# Patient Record
Sex: Male | Born: 1945 | Race: White | Hispanic: No | Marital: Single | Smoking: Never smoker
Health system: Southern US, Community
[De-identification: ages and names within clinical notes are randomized; demographics above are authoritative.]

## PROBLEM LIST (undated history)

## (undated) DIAGNOSIS — E785 Hyperlipidemia, unspecified: Secondary | ICD-10-CM

## (undated) DIAGNOSIS — H269 Unspecified cataract: Secondary | ICD-10-CM

## (undated) DIAGNOSIS — I10 Essential (primary) hypertension: Secondary | ICD-10-CM

## (undated) DIAGNOSIS — G4730 Sleep apnea, unspecified: Secondary | ICD-10-CM

## (undated) DIAGNOSIS — E119 Type 2 diabetes mellitus without complications: Secondary | ICD-10-CM

## (undated) HISTORY — DX: Hyperlipidemia, unspecified: E78.5

## (undated) HISTORY — DX: Sleep apnea, unspecified: G47.30

## (undated) HISTORY — DX: Unspecified cataract: H26.9

## (undated) HISTORY — PX: COLONOSCOPY: SHX174

## (undated) HISTORY — DX: Essential (primary) hypertension: I10

## (undated) HISTORY — DX: Type 2 diabetes mellitus without complications: E11.9

## (undated) HISTORY — PX: CYSTECTOMY: SUR359

---

## 1999-05-14 ENCOUNTER — Encounter (INDEPENDENT_AMBULATORY_CARE_PROVIDER_SITE_OTHER): Payer: Self-pay

## 1999-05-14 ENCOUNTER — Other Ambulatory Visit: Admission: RE | Admit: 1999-05-14 | Discharge: 1999-05-14 | Payer: Self-pay | Admitting: Surgery

## 1999-05-23 ENCOUNTER — Emergency Department (HOSPITAL_COMMUNITY): Admission: EM | Admit: 1999-05-23 | Discharge: 1999-05-23 | Payer: Self-pay | Admitting: *Deleted

## 2000-04-12 ENCOUNTER — Encounter: Payer: Self-pay | Admitting: Internal Medicine

## 2000-04-12 ENCOUNTER — Ambulatory Visit (HOSPITAL_COMMUNITY): Admission: RE | Admit: 2000-04-12 | Discharge: 2000-04-12 | Payer: Self-pay | Admitting: Internal Medicine

## 2000-06-12 ENCOUNTER — Ambulatory Visit (HOSPITAL_COMMUNITY): Admission: RE | Admit: 2000-06-12 | Discharge: 2000-06-12 | Payer: Self-pay | Admitting: Gastroenterology

## 2008-04-06 ENCOUNTER — Encounter: Admission: RE | Admit: 2008-04-06 | Discharge: 2008-04-06 | Payer: Self-pay | Admitting: Family Medicine

## 2010-08-03 NOTE — Procedures (Signed)
[place]  Patient:    Edward Boyd, Edward Boyd                     MRN: [id number] Proc. Date: 06/12/00 Adm. Date:  [id number] Attending:  Charna Elizabeth CC:         Lilly Cove, M.D.                           Procedure Report  DATE OF BIRTH:  [birth year]  PROCEDURE PERFORMED:  Flexible sigmoidoscopy.  ENDOSCOPIST:  Anselmo Rod, M.D.  INSTRUMENT USED:  Olympus video colonoscope.  INDICATIONS FOR PROCEDURE:  Screening flexible sigmoidoscopy being performed in a 65 year old white male rule out colonic polyps, masses, hemorrhoids etc.  PREPROCEDURE PREPARATION:  Informed consent was procured from the patient. The patient was fasted for eight hours prior to the procedure and prepped with a bottle of magnesium citrate and two Fleets enemas on the morning of the procedure.  PREPROCEDURE PHYSICAL:  The patient had stable vital signs.  Neck supple. Chest clear to auscultation.  S1 and S2 regular, abdomen soft with normal abdominal bowel sounds.  DESCRIPTION OF PROCEDURE:  The patient was placed in left lateral decubitus position and sedated with 40 mg of Demerol and 4 mg of Versed intravenously. Once the patient was adequately sedated and maintained on low-flow oxygen  and continuous cardiac monitoring, the Olympus video colonoscope was advanced from the rectum to the cecum without difficulty.  The patient had an excellent prep and therefore the procedure was completed up to the cecum even though this was a flexible sigmoidoscopy.  No masses, polyps, erosions, ulcerations or diverticula were seen.  The appendicular orifice and the ileocecal valve were clearly visualized and photographed.  IMPRESSION:  Normal colonoscopy.  RECOMMENDATIONS:  Repeat colorectal cancer screening is recommended in the next 10 years unless the patient were to develop any abnormal symptoms in the interim. DD:  06/12/00 TD:  06/12/00 Job: [id number]

## 2011-02-23 ENCOUNTER — Ambulatory Visit (INDEPENDENT_AMBULATORY_CARE_PROVIDER_SITE_OTHER): Payer: 59

## 2011-02-23 DIAGNOSIS — J019 Acute sinusitis, unspecified: Secondary | ICD-10-CM

## 2011-03-08 ENCOUNTER — Ambulatory Visit (INDEPENDENT_AMBULATORY_CARE_PROVIDER_SITE_OTHER): Payer: 59

## 2011-03-08 DIAGNOSIS — R071 Chest pain on breathing: Secondary | ICD-10-CM

## 2011-03-08 DIAGNOSIS — J019 Acute sinusitis, unspecified: Secondary | ICD-10-CM

## 2011-03-08 DIAGNOSIS — J209 Acute bronchitis, unspecified: Secondary | ICD-10-CM

## 2011-03-10 ENCOUNTER — Ambulatory Visit (INDEPENDENT_AMBULATORY_CARE_PROVIDER_SITE_OTHER): Payer: 59

## 2011-03-10 DIAGNOSIS — N453 Epididymo-orchitis: Secondary | ICD-10-CM

## 2017-10-31 ENCOUNTER — Ambulatory Visit (INDEPENDENT_AMBULATORY_CARE_PROVIDER_SITE_OTHER): Payer: No Typology Code available for payment source | Admitting: Podiatry

## 2017-10-31 ENCOUNTER — Encounter: Payer: Self-pay | Admitting: Podiatry

## 2017-10-31 VITALS — BP 125/68 | HR 70

## 2017-10-31 DIAGNOSIS — B351 Tinea unguium: Secondary | ICD-10-CM | POA: Diagnosis not present

## 2017-10-31 DIAGNOSIS — E119 Type 2 diabetes mellitus without complications: Secondary | ICD-10-CM | POA: Diagnosis not present

## 2017-10-31 DIAGNOSIS — E1142 Type 2 diabetes mellitus with diabetic polyneuropathy: Secondary | ICD-10-CM | POA: Diagnosis not present

## 2017-10-31 MED ORDER — GABAPENTIN 300 MG PO CAPS: 300.0000 mg | ORAL_CAPSULE | Freq: Four times a day (QID) | ORAL | 0 refills | Status: AC

## 2017-11-26 NOTE — Progress Notes (Signed)
  Subjective:  Patient ID: Edward Boyd, male    DOB: 1946-01-06,  MRN: [id number]  Chief Complaint  Patient presents with  . Diabetes    diabetic foot exam and debride  . Nail Problem    possible ingrown 2nd left toenail  . Peripheral Neuropathy    numbness and pain increased, bilateral feet x 4 months    72 y.o. male presents  for diabetic foot care. Last AMBS was unknown.  Reports numbness and tingling in their feet. Denies cramping in legs and thighs.  Review of Systems: Negative except as noted in the HPI. Denies N/V/F/Ch.  History reviewed. No pertinent past medical history.  Current Outpatient Medications:  .  aspirin EC 81 MG tablet, Take 81 mg by mouth daily., Disp: , Rfl:  .  cholecalciferol (VITAMIN D) 400 units TABS tablet, Take 400 Units by mouth., Disp: , Rfl:  .  gabapentin (NEURONTIN) 300 MG capsule, Take 1 capsule (300 mg total) by mouth 4 (four) times daily., Disp: 120 capsule, Rfl: 0 .  hydrochlorothiazide (HYDRODIURIL) 25 MG tablet, Take 25 mg by mouth daily., Disp: , Rfl:  .  insulin regular (NOVOLIN R,HUMULIN R) 100 units/mL injection, Inject into the skin 3 (three) times daily before meals., Disp: , Rfl:  .  lisinopril (PRINIVIL,ZESTRIL) 40 MG tablet, Take 40 mg by mouth daily., Disp: , Rfl:  .  magnesium gluconate (MAGONATE) 500 MG tablet, Take 500 mg by mouth 2 (two) times daily., Disp: , Rfl:  .  metFORMIN (GLUCOPHAGE) 1000 MG tablet, Take 1,000 mg by mouth 2 (two) times daily with a meal., Disp: , Rfl:  .  sildenafil (REVATIO) 20 MG tablet, Take 20 mg by mouth 3 (three) times daily., Disp: , Rfl:  .  simvastatin (ZOCOR) 80 MG tablet, Take 80 mg by mouth daily., Disp: , Rfl:  .  vitamin B-12 (CYANOCOBALAMIN) 1000 MCG tablet, Take 1,000 mcg by mouth daily., Disp: , Rfl:   Social History   Tobacco Use  Smoking Status Not on file    Allergies  Allergen Reactions  . Penicillins    Objective:   Vitals:   10/31/17 1450  BP: 125/68  Pulse: 70    There is no height or weight on file to calculate BMI. Constitutional Well developed. Well nourished.  Vascular Dorsalis pedis pulses present 1+ bilaterally  Posterior tibial pulses present 1+ bilaterally  Pedal hair growth diminished. Capillary refill normal to all digits.  No cyanosis or clubbing noted.  Neurologic Normal speech. Oriented to person, place, and time. Epicritic sensation to light touch grossly present bilaterally. Protective sensation with 5.07 monofilament  absent  bilaterally.  Dermatologic Nails elongated, thickened, dystrophic. No open wounds. No skin lesions.  Orthopedic: Normal joint ROM without pain or crepitus bilaterally. No visible deformities. No bony tenderness.   Assessment:   1. DM type 2 with diabetic peripheral neuropathy ([place])   2. Onychomycosis   3. Encounter for diabetic foot exam [facility])    Plan:  Patient was evaluated and treated and all questions answered.  Diabetes with DPN, Onychomycosis -Educated on diabetic footcare. Diabetic risk level 1 -Nails x10 debrided sharply and manually with large nail nipper and rotary burr.   Procedure: Nail Debridement Rationale: Patient meets criteria for routine foot care due to DPN Type of Debridement: manual, sharp debridement. Instrumentation: Nail nipper, rotary burr. Number of Nails: 10  Return in about 3 months (around 01/31/2018) for Diabetic Foot Care.

## 2018-05-21 ENCOUNTER — Encounter: Payer: Self-pay | Admitting: Internal Medicine

## 2018-06-04 ENCOUNTER — Telehealth: Payer: Self-pay | Admitting: *Deleted

## 2018-06-04 NOTE — Telephone Encounter (Signed)
Covid-19 travel screening questions  Have you traveled in the last 14 days? No If yes where?   Do you now or have you had a fever in the last 14 days? No  Do you have any respiratory symptoms of shortness of breath or cough now or in the last 14 days? No  Do you have a medical history of Congestive Heart Failure? No  Do you have a medical history of lung disease? No  Do you have any family members or close contacts with diagnosed or suspected Covid-19? No        

## 2018-06-18 ENCOUNTER — Encounter: Payer: Self-pay | Admitting: Internal Medicine

## 2018-07-10 ENCOUNTER — Encounter: Payer: Self-pay | Admitting: Internal Medicine

## 2018-07-30 ENCOUNTER — Encounter: Payer: Self-pay | Admitting: Internal Medicine

## 2018-08-03 ENCOUNTER — Other Ambulatory Visit: Payer: Self-pay

## 2018-08-03 ENCOUNTER — Ambulatory Visit: Payer: Non-veteran care | Admitting: *Deleted

## 2018-08-03 VITALS — Ht 70.0 in | Wt 250.0 lb

## 2018-08-03 DIAGNOSIS — Z1211 Encounter for screening for malignant neoplasm of colon: Secondary | ICD-10-CM

## 2018-08-03 MED ORDER — NA SULFATE-K SULFATE-MG SULF 17.5-3.13-1.6 GM/177ML PO SOLN: 1.0000 | Freq: Once | ORAL | 0 refills | Status: AC

## 2018-08-03 NOTE — Progress Notes (Signed)
No egg or soy allergy known to patient  No issues with past sedation with any surgeries  or procedures, no intubation problems  No diet pills per patient No home 02 use per patient  No blood thinners per patient  Pt denies issues with constipation  No A fib or A flutter  EMMI video declined    Pt mailed instruction packet to included paper to complete and mail back to [facility] with addressed and stamped envelope, Emmi video, copy of consent form to read and not return, and instructions. PV completed over the phone. Pt encouraged to call with questions or issues - pt will be given a Suprep sample- pt will pick up prep and packet 3rd floor this week    Suprep Lot- [id number] exp 04/2020- pt to pick up and wear a mask

## 2018-09-07 ENCOUNTER — Encounter: Payer: Self-pay | Admitting: Internal Medicine

## 2018-09-16 ENCOUNTER — Encounter: Payer: Self-pay | Admitting: Internal Medicine

## 2018-10-27 ENCOUNTER — Other Ambulatory Visit: Payer: Self-pay

## 2018-10-27 ENCOUNTER — Ambulatory Visit (AMBULATORY_SURGERY_CENTER): Payer: Self-pay | Admitting: *Deleted

## 2018-10-27 VITALS — Ht 70.0 in | Wt 225.0 lb

## 2018-10-27 DIAGNOSIS — Z1211 Encounter for screening for malignant neoplasm of colon: Secondary | ICD-10-CM

## 2018-10-27 NOTE — Progress Notes (Signed)
Pt's previsit is done over the phone and all paperwork (prep instructions, blank consent form to just read over, pre-procedure acknowledgement form and stamped envelope) sent to patient  Pt is aware that care partner will wait in the car during procedure; if they feel like they will be too hot to wait in the car; they may wait in the lobby.  We want them to wear a mask (we do not have any that we can provide them), practice social distancing, and we will check their temperatures when they get here.  I did remind patient that their care partner needs to stay in the parking lot the entire time. Pt will wear mask into building.   Pt has Suprep at home  No egg or soy allergy  No home oxygen use or problems with anesthesia per pt  No medications for weight loss taken  emmi information given

## 2018-11-09 ENCOUNTER — Telehealth: Payer: Self-pay

## 2018-11-09 ENCOUNTER — Telehealth: Payer: Self-pay | Admitting: Internal Medicine

## 2018-11-09 NOTE — Telephone Encounter (Signed)
Pt and I discussed how to mix the Suprep tonight at 6 pm - add 1 bottle Suprep  to the plastic container and then add water to the top 16 oz line- mix and drink over 30 minutes followed by 2 - 16 oz containers of water  And then he will repeat the same thing tomorrow at 830 am and stop all liquids by 1030 am - pt verbalized understanding of instructions given today

## 2018-11-09 NOTE — Telephone Encounter (Signed)
Patient had questions when he was doing his covid screening call. He was unsure if he had to take his metformin tomorrow. I advised him not to taker his metformin the day of procedure. He understood and and we went through the remainder of his medications and now feels confident about it. He will be here tomorrow for his procedure and had no further questions.

## 2018-11-09 NOTE — Telephone Encounter (Signed)
Pt has a question about his prep.  His procedure is 11/10/18

## 2018-11-09 NOTE — Telephone Encounter (Signed)
Covid-19 screening questions   Do you now or have you had a fever in the last 14 days?  Do you have any respiratory symptoms of shortness of breath or cough now or in the last 14 days?  Do you have any family members or close contacts with diagnosed or suspected Covid-19 in the past 14 days?  Have you been tested for Covid-19 and found to be positive?      L/m to c/b.

## 2018-11-09 NOTE — Telephone Encounter (Signed)
Patient called back and answered no to all screening questions. Is requesting to speak to nurse because he is confused with his medications and he is diabetic.

## 2018-11-10 ENCOUNTER — Encounter: Payer: Self-pay | Admitting: Internal Medicine

## 2018-11-10 ENCOUNTER — Ambulatory Visit (AMBULATORY_SURGERY_CENTER): Payer: No Typology Code available for payment source | Admitting: Internal Medicine

## 2018-11-10 ENCOUNTER — Other Ambulatory Visit: Payer: Self-pay

## 2018-11-10 VITALS — BP 113/63 | HR 75 | Temp 98.8°F | Resp 13 | Ht 70.0 in | Wt 225.0 lb

## 2018-11-10 DIAGNOSIS — D124 Benign neoplasm of descending colon: Secondary | ICD-10-CM | POA: Diagnosis not present

## 2018-11-10 DIAGNOSIS — D122 Benign neoplasm of ascending colon: Secondary | ICD-10-CM

## 2018-11-10 DIAGNOSIS — D123 Benign neoplasm of transverse colon: Secondary | ICD-10-CM

## 2018-11-10 DIAGNOSIS — Z1211 Encounter for screening for malignant neoplasm of colon: Secondary | ICD-10-CM

## 2018-11-10 MED ORDER — SODIUM CHLORIDE 0.9 % IV SOLN: 500.0000 mL | Freq: Once | INTRAVENOUS | Status: DC

## 2018-11-10 NOTE — Progress Notes (Signed)
Pt's states no medical or surgical changes since previsit or office visit.  MB temp CW vitals.

## 2018-11-10 NOTE — Progress Notes (Signed)
Called to room to assist during endoscopic procedure.  Patient ID and intended procedure confirmed with present staff. Received instructions for my participation in the procedure from the performing physician.  

## 2018-11-10 NOTE — Op Note (Signed)
[place] Patient Name: Edward Boyd Procedure Date: 11/10/2018 1:31 PM MRN: [id number] Endoscopist: Docia Chuck. Henrene Pastor , MD Age: 73 Referring MD:  Date of Birth: 1945/09/11 Gender: Male Account #: [id number] Procedure:                Colonoscopy with cold snare polypectomy x 1 Indications:              Screening for colorectal malignant neoplasm.                            Negative index exam elsewhere 2002 Medicines:                Monitored Anesthesia Care Procedure:                Pre-Anesthesia Assessment:                           - Prior to the procedure, a History and Physical                            was performed, and patient medications and                            allergies were reviewed. The patient's tolerance of                            previous anesthesia was also reviewed. The risks                            and benefits of the procedure and the sedation                            options and risks were discussed with the patient.                            All questions were answered, and informed consent                            was obtained. Prior Anticoagulants: The patient has                            taken no previous anticoagulant or antiplatelet                            agents. ASA Grade Assessment: II - A patient with                            mild systemic disease. After reviewing the risks                            and benefits, the patient was deemed in                            satisfactory condition to undergo the procedure.  After obtaining informed consent, the colonoscope                            was passed under direct vision. Throughout the                            procedure, the patient's blood pressure, pulse, and                            oxygen saturations were monitored continuously. The                            Colonoscope was introduced through the anus and   advanced to the the cecum, identified by                            appendiceal orifice and ileocecal valve. The                            ileocecal valve, appendiceal orifice, and rectum                            were photographed. The quality of the bowel                            preparation was excellent. The colonoscopy was                            performed without difficulty. The patient tolerated                            the procedure well. The bowel preparation used was                            SUPREP via split dose instruction. Scope In: 1:35:51 PM Scope Out: 1:45:59 PM Scope Withdrawal Time: 0 hours 7 minutes 0 seconds  Total Procedure Duration: 0 hours 10 minutes 8 seconds  Findings:                 Two polyps were found in the transverse colon and                            ascending colon. The polyps were 1 to 3 mm in size.                            These polyps were removed with a cold snare.                            Resection and retrieval were complete.                           The exam was otherwise without abnormality on  direct and retroflexion views. Complications:            No immediate complications. Estimated blood loss:                            None. Estimated Blood Loss:     Estimated blood loss: none. Impression:               - Two 1 to 3 mm polyps in the transverse colon and                            in the ascending colon, removed with a cold snare.                            Resected and retrieved.                           - The examination was otherwise normal on direct                            and retroflexion views. Recommendation:           - Repeat colonoscopy is not recommended for                            surveillance.                           - Patient has a contact number available for                            emergencies. The signs and symptoms of potential                            delayed  complications were discussed with the                            patient. Return to normal activities tomorrow.                            Written discharge instructions were provided to the                            patient.                           - Resume previous diet.                           - Continue present medications.                           - Await pathology results. Docia Chuck. Henrene Pastor, MD 11/10/2018 1:50:53 PM This report has been signed electronically.

## 2018-11-10 NOTE — Progress Notes (Signed)
PT taken to PACU. Monitors in place. VSS. Report given to RN. 

## 2018-11-10 NOTE — Patient Instructions (Signed)
YOU HAD AN ENDOSCOPIC PROCEDURE TODAY AT THE [facility]:   Refer to the procedure report that was given to you for any specific questions about what was found during the examination.  If the procedure report does not answer your questions, please call your gastroenterologist to clarify.  If you requested that your care partner not be given the details of your procedure findings, then the procedure report has been included in a sealed envelope for you to review at your convenience later.  YOU SHOULD EXPECT: Some feelings of bloating in the abdomen. Passage of more gas than usual.  Walking can help get rid of the air that was put into your GI tract during the procedure and reduce the bloating. If you had a lower endoscopy (such as a colonoscopy or flexible sigmoidoscopy) you may notice spotting of blood in your stool or on the toilet paper. If you underwent a bowel prep for your procedure, you may not have a normal bowel movement for a few days.  Please Note:  You might notice some irritation and congestion in your nose or some drainage.  This is from the oxygen used during your procedure.  There is no need for concern and it should clear up in a day or so.  SYMPTOMS TO REPORT IMMEDIATELY:   Following lower endoscopy (colonoscopy or flexible sigmoidoscopy):  Excessive amounts of blood in the stool  Significant tenderness or worsening of abdominal pains  Swelling of the abdomen that is new, acute  Fever of 100F or higher  For urgent or emergent issues, a gastroenterologist can be reached at any hour by calling [phone].   DIET:  We do recommend a small meal at first, but then you may proceed to your regular diet.  Drink plenty of fluids but you should avoid alcoholic beverages for 24 hours.  ACTIVITY:  You should plan to take it easy for the rest of today and you should NOT DRIVE or use heavy machinery until tomorrow (because of the sedation medicines used during the test).     FOLLOW UP: Our staff will call the number listed on your records 48-72 hours following your procedure to check on you and address any questions or concerns that you may have regarding the information given to you following your procedure. If we do not reach you, we will leave a message.  We will attempt to reach you two times.  During this call, we will ask if you have developed any symptoms of COVID 19. If you develop any symptoms (ie: fever, flu-like symptoms, shortness of breath, cough etc.) before then, please call [phone].  If you test positive for Covid 19 in the 2 weeks post procedure, please call and report this information to us.    If any biopsies were taken you will be contacted by phone or by letter within the next 1-3 weeks.  Please call us at [phone] if you have not heard about the biopsies in 3 weeks.    SIGNATURES/CONFIDENTIALITY: You and/or your care partner have signed paperwork which will be entered into your electronic medical record.  These signatures attest to the fact that that the information above on your After Visit Summary has been reviewed and is understood.  Full responsibility of the confidentiality of this discharge information lies with you and/or your care-partner. 

## 2018-11-12 ENCOUNTER — Telehealth: Payer: Self-pay | Admitting: *Deleted

## 2018-11-12 NOTE — Telephone Encounter (Signed)
1. Have you developed a fever since your procedure? no  2.   Have you had an respiratory symptoms (SOB or cough) since your procedure? no  3.   Have you tested positive for COVID 19 since your procedure no  4.   Have you had any family members/close contacts diagnosed with the COVID 19 since your procedure?  no   If yes to any of these questions please route to Joylene John, RN and Alphonsa Gin, Therapist, sports.  Follow up Call-  Call back number 11/10/2018  Post procedure Call Back phone  # [phone]  Permission to leave phone message Yes  Some recent data might be hidden     Patient questions:  Do you have a fever, pain , or abdominal swelling? No. Pain Score  0 *  Have you tolerated food without any problems? Yes.    Have you been able to return to your normal activities? Yes.    Do you have any questions about your discharge instructions: Diet   No. Medications  No. Follow up visit  No.  Do you have questions or concerns about your Care? No.  Actions: * If pain score is 4 or above: No action needed, pain <4.

## 2018-11-14 ENCOUNTER — Encounter: Payer: Self-pay | Admitting: Internal Medicine

## 2019-03-25 ENCOUNTER — Encounter (HOSPITAL_COMMUNITY): Payer: Self-pay

## 2019-03-25 ENCOUNTER — Other Ambulatory Visit: Payer: Self-pay

## 2019-03-25 ENCOUNTER — Inpatient Hospital Stay (HOSPITAL_COMMUNITY)
Admission: EM | Admit: 2019-03-25 | Discharge: 2019-03-27 | DRG: 684 | Disposition: A | Discharge status: Home / Self Care | Payer: No Typology Code available for payment source | Attending: Family Medicine | Admitting: Family Medicine

## 2019-03-25 DIAGNOSIS — N19 Unspecified kidney failure: Secondary | ICD-10-CM

## 2019-03-25 DIAGNOSIS — I10 Essential (primary) hypertension: Secondary | ICD-10-CM | POA: Diagnosis present

## 2019-03-25 DIAGNOSIS — Z794 Long term (current) use of insulin: Secondary | ICD-10-CM

## 2019-03-25 DIAGNOSIS — E669 Obesity, unspecified: Secondary | ICD-10-CM | POA: Diagnosis present

## 2019-03-25 DIAGNOSIS — N170 Acute kidney failure with tubular necrosis: Principal | ICD-10-CM | POA: Diagnosis present

## 2019-03-25 DIAGNOSIS — Z6835 Body mass index (BMI) 35.0-35.9, adult: Secondary | ICD-10-CM

## 2019-03-25 DIAGNOSIS — Z7982 Long term (current) use of aspirin: Secondary | ICD-10-CM

## 2019-03-25 DIAGNOSIS — Z79899 Other long term (current) drug therapy: Secondary | ICD-10-CM

## 2019-03-25 DIAGNOSIS — E119 Type 2 diabetes mellitus without complications: Secondary | ICD-10-CM | POA: Diagnosis present

## 2019-03-25 DIAGNOSIS — Z20822 Contact with and (suspected) exposure to covid-19: Secondary | ICD-10-CM | POA: Diagnosis present

## 2019-03-25 DIAGNOSIS — R55 Syncope and collapse: Secondary | ICD-10-CM | POA: Diagnosis present

## 2019-03-25 DIAGNOSIS — E785 Hyperlipidemia, unspecified: Secondary | ICD-10-CM | POA: Diagnosis present

## 2019-03-25 DIAGNOSIS — Z880 Allergy status to penicillin: Secondary | ICD-10-CM

## 2019-03-25 DIAGNOSIS — G4733 Obstructive sleep apnea (adult) (pediatric): Secondary | ICD-10-CM | POA: Diagnosis present

## 2019-03-25 DIAGNOSIS — N179 Acute kidney failure, unspecified: Secondary | ICD-10-CM | POA: Diagnosis present

## 2019-03-25 NOTE — ED Triage Notes (Signed)
Pt fell at home, has been dizzy.  Per ems, pt's blood pressure dropped to 99991111 systolic

## 2019-03-26 ENCOUNTER — Emergency Department (HOSPITAL_COMMUNITY): Payer: No Typology Code available for payment source

## 2019-03-26 ENCOUNTER — Inpatient Hospital Stay (HOSPITAL_COMMUNITY): Payer: No Typology Code available for payment source

## 2019-03-26 DIAGNOSIS — Z79899 Other long term (current) drug therapy: Secondary | ICD-10-CM | POA: Diagnosis not present

## 2019-03-26 DIAGNOSIS — N170 Acute kidney failure with tubular necrosis: Secondary | ICD-10-CM | POA: Diagnosis present

## 2019-03-26 DIAGNOSIS — Z880 Allergy status to penicillin: Secondary | ICD-10-CM | POA: Diagnosis not present

## 2019-03-26 DIAGNOSIS — Z794 Long term (current) use of insulin: Secondary | ICD-10-CM | POA: Diagnosis not present

## 2019-03-26 DIAGNOSIS — E119 Type 2 diabetes mellitus without complications: Secondary | ICD-10-CM | POA: Diagnosis present

## 2019-03-26 DIAGNOSIS — E785 Hyperlipidemia, unspecified: Secondary | ICD-10-CM | POA: Diagnosis present

## 2019-03-26 DIAGNOSIS — Z20822 Contact with and (suspected) exposure to covid-19: Secondary | ICD-10-CM | POA: Diagnosis present

## 2019-03-26 DIAGNOSIS — N19 Unspecified kidney failure: Secondary | ICD-10-CM | POA: Diagnosis not present

## 2019-03-26 DIAGNOSIS — E669 Obesity, unspecified: Secondary | ICD-10-CM | POA: Diagnosis present

## 2019-03-26 DIAGNOSIS — G4733 Obstructive sleep apnea (adult) (pediatric): Secondary | ICD-10-CM | POA: Diagnosis present

## 2019-03-26 DIAGNOSIS — R55 Syncope and collapse: Secondary | ICD-10-CM

## 2019-03-26 DIAGNOSIS — I10 Essential (primary) hypertension: Secondary | ICD-10-CM | POA: Diagnosis present

## 2019-03-26 DIAGNOSIS — Z6835 Body mass index (BMI) 35.0-35.9, adult: Secondary | ICD-10-CM | POA: Diagnosis not present

## 2019-03-26 DIAGNOSIS — Z7982 Long term (current) use of aspirin: Secondary | ICD-10-CM | POA: Diagnosis not present

## 2019-03-26 DIAGNOSIS — N179 Acute kidney failure, unspecified: Secondary | ICD-10-CM

## 2019-03-26 LAB — CBC WITH DIFFERENTIAL/PLATELET
Abs Immature Granulocytes: 0.05 10*3/uL (ref 0.00–0.07)
Basophils Absolute: 0 10*3/uL (ref 0.0–0.1)
Basophils Relative: 0 %
Eosinophils Absolute: 0.1 10*3/uL (ref 0.0–0.5)
Eosinophils Relative: 1 %
HCT: 43.5 % (ref 39.0–52.0)
Hemoglobin: 13.9 g/dL (ref 13.0–17.0)
Immature Granulocytes: 0 %
Lymphocytes Relative: 14 %
Lymphs Abs: 1.8 10*3/uL (ref 0.7–4.0)
MCH: 29.4 pg (ref 26.0–34.0)
MCHC: 32 g/dL (ref 30.0–36.0)
MCV: 92 fL (ref 80.0–100.0)
Monocytes Absolute: 1.4 10*3/uL — ABNORMAL HIGH (ref 0.1–1.0)
Monocytes Relative: 10 %
Neutro Abs: 10.2 10*3/uL — ABNORMAL HIGH (ref 1.7–7.7)
Neutrophils Relative %: 75 %
Platelets: 236 10*3/uL (ref 150–400)
RBC: 4.73 MIL/uL (ref 4.22–5.81)
RDW: 13.6 % (ref 11.5–15.5)
WBC: 13.6 10*3/uL — ABNORMAL HIGH (ref 4.0–10.5)
nRBC: 0 % (ref 0.0–0.2)

## 2019-03-26 LAB — URINALYSIS, ROUTINE W REFLEX MICROSCOPIC
Bacteria, UA: NONE SEEN
Bilirubin Urine: NEGATIVE
Glucose, UA: NEGATIVE mg/dL
Ketones, ur: NEGATIVE mg/dL
Nitrite: NEGATIVE
Protein, ur: 30 mg/dL — AB
Specific Gravity, Urine: 1.014 (ref 1.005–1.030)
pH: 5 (ref 5.0–8.0)

## 2019-03-26 LAB — COMPREHENSIVE METABOLIC PANEL
ALT: 19 U/L (ref 0–44)
ALT: 16 U/L (ref 0–44)
AST: 25 U/L (ref 15–41)
AST: 22 U/L (ref 15–41)
Albumin: 4.2 g/dL (ref 3.5–5.0)
Albumin: 3.7 g/dL (ref 3.5–5.0)
Alkaline Phosphatase: 56 U/L (ref 38–126)
Alkaline Phosphatase: 50 U/L (ref 38–126)
Anion gap: 16 — ABNORMAL HIGH (ref 5–15)
Anion gap: 12 (ref 5–15)
BUN: 56 mg/dL — ABNORMAL HIGH (ref 8–23)
BUN: 55 mg/dL — ABNORMAL HIGH (ref 8–23)
CO2: 20 mmol/L — ABNORMAL LOW (ref 22–32)
CO2: 23 mmol/L (ref 22–32)
Calcium: 9.6 mg/dL (ref 8.9–10.3)
Calcium: 9.1 mg/dL (ref 8.9–10.3)
Chloride: 98 mmol/L (ref 98–111)
Chloride: 99 mmol/L (ref 98–111)
Creatinine, Ser: 6.09 mg/dL — ABNORMAL HIGH (ref 0.61–1.24)
Creatinine, Ser: 5.79 mg/dL — ABNORMAL HIGH (ref 0.61–1.24)
GFR calc Af Amer: 10 mL/min — ABNORMAL LOW (ref >60)
GFR calc Af Amer: 10 mL/min — ABNORMAL LOW (ref >60)
GFR calc non Af Amer: 8 mL/min — ABNORMAL LOW (ref >60)
GFR calc non Af Amer: 9 mL/min — ABNORMAL LOW (ref >60)
Glucose, Bld: 196 mg/dL — ABNORMAL HIGH (ref 70–99)
Glucose, Bld: 170 mg/dL — ABNORMAL HIGH (ref 70–99)
Potassium: 4.1 mmol/L (ref 3.5–5.1)
Potassium: 3.9 mmol/L (ref 3.5–5.1)
Sodium: 134 mmol/L — ABNORMAL LOW (ref 135–145)
Sodium: 134 mmol/L — ABNORMAL LOW (ref 135–145)
Total Bilirubin: 0.6 mg/dL (ref 0.3–1.2)
Total Bilirubin: 0.6 mg/dL (ref 0.3–1.2)
Total Protein: 7.4 g/dL (ref 6.5–8.1)
Total Protein: 6.7 g/dL (ref 6.5–8.1)

## 2019-03-26 LAB — PROTEIN / CREATININE RATIO, URINE
Creatinine, Urine: 249.31 mg/dL
Protein Creatinine Ratio: 0.28 mg/mg{Cre} — ABNORMAL HIGH (ref 0.00–0.15)
Total Protein, Urine: 69 mg/dL

## 2019-03-26 LAB — SODIUM, URINE, RANDOM: Sodium, Ur: 49 mmol/L

## 2019-03-26 LAB — CBG MONITORING, ED
Glucose-Capillary: 150 mg/dL — ABNORMAL HIGH (ref 70–99)
Glucose-Capillary: 211 mg/dL — ABNORMAL HIGH (ref 70–99)
Glucose-Capillary: 211 mg/dL — ABNORMAL HIGH (ref 70–99)
Glucose-Capillary: 257 mg/dL — ABNORMAL HIGH (ref 70–99)

## 2019-03-26 LAB — CBC
HCT: 40.3 % (ref 39.0–52.0)
Hemoglobin: 12.9 g/dL — ABNORMAL LOW (ref 13.0–17.0)
MCH: 29.5 pg (ref 26.0–34.0)
MCHC: 32 g/dL (ref 30.0–36.0)
MCV: 92.2 fL (ref 80.0–100.0)
Platelets: 214 10*3/uL (ref 150–400)
RBC: 4.37 MIL/uL (ref 4.22–5.81)
RDW: 13.7 % (ref 11.5–15.5)
WBC: 9.3 10*3/uL (ref 4.0–10.5)
nRBC: 0 % (ref 0.0–0.2)

## 2019-03-26 LAB — TROPONIN I (HIGH SENSITIVITY)
Troponin I (High Sensitivity): 7 ng/L (ref <18)
Troponin I (High Sensitivity): 5 ng/L (ref <18)

## 2019-03-26 LAB — HEMOGLOBIN A1C
Hgb A1c MFr Bld: 8.5 % — ABNORMAL HIGH (ref 4.8–5.6)
Mean Plasma Glucose: 197.25 mg/dL

## 2019-03-26 LAB — BRAIN NATRIURETIC PEPTIDE: B Natriuretic Peptide: 45 pg/mL (ref 0.0–100.0)

## 2019-03-26 LAB — MAGNESIUM: Magnesium: 1.8 mg/dL (ref 1.7–2.4)

## 2019-03-26 LAB — SARS CORONAVIRUS 2 (TAT 6-24 HRS): SARS Coronavirus 2: NEGATIVE

## 2019-03-26 LAB — CREATININE, URINE, RANDOM: Creatinine, Urine: 246.67 mg/dL

## 2019-03-26 LAB — CK: Total CK: 391 U/L (ref 49–397)

## 2019-03-26 LAB — I-STAT CREATININE, ED: Creatinine, Ser: 5.5 mg/dL — ABNORMAL HIGH (ref 0.61–1.24)

## 2019-03-26 MED ORDER — ONDANSETRON HCL 4 MG PO TABS: 4.0000 mg | ORAL_TABLET | Freq: Four times a day (QID) | ORAL | Status: DC | PRN

## 2019-03-26 MED ORDER — SODIUM CHLORIDE 0.9 % IV SOLN: INTRAVENOUS | Status: DC

## 2019-03-26 MED ORDER — ATORVASTATIN CALCIUM 40 MG PO TABS
40.0000 mg | ORAL_TABLET | Freq: Every day | ORAL | Status: DC
  Administered 2019-03-26: 40 mg via ORAL
  Filled 2019-03-26: qty 1

## 2019-03-26 MED ORDER — LACTATED RINGERS IV BOLUS
1000.0000 mL | Freq: Once | INTRAVENOUS | Status: AC
  Administered 2019-03-26: 1000 mL via INTRAVENOUS

## 2019-03-26 MED ORDER — INSULIN ASPART 100 UNIT/ML ~~LOC~~ SOLN
0.0000 [IU] | Freq: Three times a day (TID) | SUBCUTANEOUS | Status: DC
  Administered 2019-03-26: 5 [IU] via SUBCUTANEOUS
  Administered 2019-03-26: 3 [IU] via SUBCUTANEOUS
  Administered 2019-03-26: 1 [IU] via SUBCUTANEOUS
  Administered 2019-03-27: 3 [IU] via SUBCUTANEOUS
  Filled 2019-03-26 (×4): qty 1

## 2019-03-26 MED ORDER — HEPARIN SODIUM (PORCINE) 5000 UNIT/ML IJ SOLN
5000.0000 [IU] | Freq: Three times a day (TID) | INTRAMUSCULAR | Status: DC
  Administered 2019-03-26 – 2019-03-27 (×4): 5000 [IU] via SUBCUTANEOUS
  Filled 2019-03-26 (×4): qty 1

## 2019-03-26 MED ORDER — INFLUENZA VAC A&B SA ADJ QUAD 0.5 ML IM PRSY: 0.5000 mL | PREFILLED_SYRINGE | INTRAMUSCULAR | Status: DC

## 2019-03-26 MED ORDER — ONDANSETRON HCL 4 MG/2ML IJ SOLN: 4.0000 mg | Freq: Four times a day (QID) | INTRAMUSCULAR | Status: DC | PRN

## 2019-03-26 MED ORDER — INSULIN ASPART PROT & ASPART (70-30 MIX) 100 UNIT/ML ~~LOC~~ SUSP
60.0000 [IU] | Freq: Two times a day (BID) | SUBCUTANEOUS | Status: DC
  Administered 2019-03-27: 60 [IU] via SUBCUTANEOUS
  Filled 2019-03-26: qty 10

## 2019-03-26 MED ORDER — ACETAMINOPHEN 650 MG RE SUPP: 650.0000 mg | Freq: Four times a day (QID) | RECTAL | Status: DC | PRN

## 2019-03-26 MED ORDER — ACETAMINOPHEN 325 MG PO TABS
650.0000 mg | ORAL_TABLET | Freq: Four times a day (QID) | ORAL | Status: DC | PRN
  Administered 2019-03-27: 650 mg via ORAL
  Filled 2019-03-26: qty 2

## 2019-03-26 MED ORDER — ASPIRIN EC 81 MG PO TBEC
81.0000 mg | DELAYED_RELEASE_TABLET | Freq: Every day | ORAL | Status: DC
  Administered 2019-03-26: 81 mg via ORAL
  Filled 2019-03-26 (×2): qty 1

## 2019-03-26 NOTE — ED Notes (Signed)
No more than 60mL found with bladder scanner

## 2019-03-26 NOTE — Progress Notes (Signed)
Brief update note:  Chart reviewed. Patient seen and updated on plan of care. Nephrology consulted for AKI vs CKD. Appreciate input. Attempted calling patient's PCP office with [facility] ([facility]) at [phone] to get records (baseline renal function/ h/o kidney disease) but offices are closed today. I was transferred to [facility] ED where I could get some info on the patient, but no response.  Needs inpatient stay for management of elevated creatinine.   Budd Palmer, MD Internal Medicine  Hospitalist

## 2019-03-26 NOTE — ED Notes (Signed)
Hospitalist at bedside 

## 2019-03-26 NOTE — ED Notes (Signed)
Pt is a [place] vet followed at the [employer]

## 2019-03-26 NOTE — H&P (Signed)
TRH H&P    Patient Demographics:    Edward Boyd, is a 74 y.o. male  MRN: [id number]  DOB - 11/08/1945  Admit Date - 03/25/2019  Referring MD/NP/PA: Dorise Bullion  Outpatient Primary MD for the patient is Patient, No Pcp Per  Patient coming from: Home  Chief complaint-dizziness   HPI:    Edward Boyd  is a 74 y.o. male, with history of diabetes mellitus type 2, hypertension, sleep apnea, hyperlipidemia came to ED with complaints of dizziness, near syncope.  Patient says that he started feeling dizzy yesterday morning and felt very weak and fell to ground.  Patient said that he has been eating and drinking normally.  Yesterday he did not take his blood pressure medications as blood pressure was low. In the ED patient was found to be in acute kidney injury with creatinine 6.09, repeat labs showed creatinine 5.50.  Patient takes HCTZ, lisinopril, Metformin at home. He denies nausea vomiting or diarrhea. Denies chest pain or shortness of breath. Denies dysuria. Denies prostate problems. Denies history of CAD or seizures. Denies abdominal pain      Review of systems:    In addition to the HPI above,    All other systems reviewed and are negative.    Past History of the following :    Past Medical History:  Diagnosis Date  . Cataract    righ teye removed with lens implant, left eye cataract forming   . Diabetes mellitus without complication ([place])   . Hyperlipidemia   . Hypertension   . Sleep apnea    wears cpap       Past Surgical History:  Procedure Laterality Date  . COLONOSCOPY    . CYSTECTOMY     back       Social History:      Social History   Tobacco Use  . Smoking status: Never Smoker  . Smokeless tobacco: Never Used  Substance Use Topics  . Alcohol use: Yes    Comment: very rare        Family History :     Family History  Problem Relation Age of Onset  .  Colon cancer Neg Hx   . Colon polyps Neg Hx   . Esophageal cancer Neg Hx   . Rectal cancer Neg Hx   . Stomach cancer Neg Hx       Home Medications:   Prior to Admission medications   Medication Sig Start Date End Date Taking? Authorizing Provider  aspirin EC 81 MG tablet Take 81 mg by mouth daily.    [provider]  cholecalciferol (VITAMIN D) 400 units TABS tablet Take 400 Units by mouth.    [provider]  gabapentin (NEURONTIN) 300 MG capsule Take 1 capsule (300 mg total) by mouth 4 (four) times daily. 10/31/17   Evelina Bucy, DPM  hydrochlorothiazide (HYDRODIURIL) 25 MG tablet Take 25 mg by mouth daily.    [provider]  insulin NPH-regular Human (70-30) 100 UNIT/ML injection Inject into the skin. Takes 60 units twice  a day- before meals    [provider]  insulin regular (NOVOLIN R,HUMULIN R) 100 units/mL injection Inject into the skin 3 (three) times daily before meals.    [provider]  lisinopril (PRINIVIL,ZESTRIL) 40 MG tablet Take 40 mg by mouth daily.    [provider]  magnesium gluconate (MAGONATE) 500 MG tablet Take 500 mg by mouth 2 (two) times daily.    [provider]  metFORMIN (GLUCOPHAGE) 1000 MG tablet Take 1,000 mg by mouth 2 (two) times daily with a meal.    [provider]  sildenafil (REVATIO) 20 MG tablet Take 20 mg by mouth 3 (three) times daily.    [provider]  simvastatin (ZOCOR) 80 MG tablet Take 80 mg by mouth daily.    [provider]  vitamin B-12 (CYANOCOBALAMIN) 1000 MCG tablet Take 1,000 mcg by mouth daily.    [provider]     Allergies:     Allergies  Allergen Reactions  . Penicillins Rash     Physical Exam:   Vitals  Blood pressure 105/64, pulse 77, temperature 98.2 F (36.8 C), temperature source Oral, resp. rate 15, height 5\' 10"  (1.778 m), weight 113.4 kg, SpO2 94 %.  1.  General: Appears in no acute distress  2.  Psychiatric: Alert, oriented x3, intact insight and judgment  3. Neurologic: Cranial nerves II through XII grossly intact, no focal deficit noted  4. HEENMT:  Atraumatic normocephalic, extraocular muscles are intact  5. Respiratory : Clear to auscultation bilaterally  6. Cardiovascular : S1-S2, regular, no murmur auscultated  7. Gastrointestinal:  Abdomen is soft, nontender, no organomegaly      Data Review:    CBC Recent Labs  Lab 03/26/19 0100  WBC 13.6*  HGB 13.9  HCT 43.5  PLT 236  MCV 92.0  MCH 29.4  MCHC 32.0  RDW 13.6  LYMPHSABS 1.8  MONOABS 1.4*  EOSABS 0.1  BASOSABS 0.0   ------------------------------------------------------------------------------------------------------------------  Results for orders placed or performed during the hospital encounter of 03/25/19 (from the past 48 hour(s))  CBC with Differential     Status: Abnormal   Collection Time: 03/26/19  1:00 AM  Result Value Ref Range   WBC 13.6 (H) 4.0 - 10.5 K/uL   RBC 4.73 4.22 - 5.81 MIL/uL   Hemoglobin 13.9 13.0 - 17.0 g/dL   HCT 43.5 39.0 - 52.0 %   MCV 92.0 80.0 - 100.0 fL   MCH 29.4 26.0 - 34.0 pg   MCHC 32.0 30.0 - 36.0 g/dL   RDW 13.6 11.5 - 15.5 %   Platelets 236 150 - 400 K/uL   nRBC 0.0 0.0 - 0.2 %   Neutrophils Relative % 75 %   Neutro Abs 10.2 (H) 1.7 - 7.7 K/uL   Lymphocytes Relative 14 %   Lymphs Abs 1.8 0.7 - 4.0 K/uL   Monocytes Relative 10 %   Monocytes Absolute 1.4 (H) 0.1 - 1.0 K/uL   Eosinophils Relative 1 %   Eosinophils Absolute 0.1 0.0 - 0.5 K/uL   Basophils Relative 0 %   Basophils Absolute 0.0 0.0 - 0.1 K/uL   Immature Granulocytes 0 %   Abs Immature Granulocytes 0.05 0.00 - 0.07 K/uL    Comment: Performed at [facility], [address], [place], [place] [address]  Comprehensive metabolic panel     Status: Abnormal   Collection Time: 03/26/19  1:00 AM  Result Value Ref Range   Sodium 134 (L) 135 - 145  mmol/L   Potassium 4.1 3.5 - 5.1 mmol/L    Chloride 98 98 - 111 mmol/L   CO2 20 (L) 22 - 32 mmol/L   Glucose, Bld 196 (H) 70 - 99 mg/dL   BUN 56 (H) 8 - 23 mg/dL   Creatinine, Ser 6.09 (H) 0.61 - 1.24 mg/dL   Calcium 9.6 8.9 - 10.3 mg/dL   Total Protein 7.4 6.5 - 8.1 g/dL   Albumin 4.2 3.5 - 5.0 g/dL   AST 25 15 - 41 U/L   ALT 19 0 - 44 U/L   Alkaline Phosphatase 56 38 - 126 U/L   Total Bilirubin 0.6 0.3 - 1.2 mg/dL   GFR calc non Af Amer 8 (L) >60 mL/min   GFR calc Af Amer 10 (L) >60 mL/min   Anion gap 16 (H) 5 - 15    Comment: Performed at [facility], [address] [place], [place] [address]  Magnesium     Status: None   Collection Time: 03/26/19  1:00 AM  Result Value Ref Range   Magnesium 1.8 1.7 - 2.4 mg/dL    Comment: Performed at [facility], [address] [place], [place] [address]  Troponin I (High Sensitivity)     Status: None   Collection Time: 03/26/19  1:00 AM  Result Value Ref Range   Troponin I (High Sensitivity) 7 <18 ng/L    Comment: (NOTE) Elevated high sensitivity troponin I (hsTnI) values and significant  changes across serial measurements may suggest ACS but many other  chronic and acute conditions are known to elevate hsTnI results.  Refer to the "Links" section for chest pain algorithms and additional  guidance. Performed at [facility], [address], [place], [place] [address]   Brain natriuretic peptide     Status: None   Collection Time: 03/26/19  1:00 AM  Result Value Ref Range   B Natriuretic Peptide 45.0 0.0 - 100.0 pg/mL    Comment: Performed at [facility], [address] [place], [place] [address]  I-stat Creatinine, ED     Status: Abnormal   Collection Time: 03/26/19  2:54 AM  Result Value Ref Range   Creatinine, Ser 5.50 (H) 0.61 - 1.24 mg/dL    Chemistries  Recent Labs  Lab 03/26/19 0100 03/26/19 0254  NA 134*  --   K 4.1  --   CL 98  --   CO2 20*  --   GLUCOSE 196*  --   BUN 56*  --   CREATININE 6.09* 5.50*  CALCIUM 9.6  --   MG 1.8  --   AST 25  --   ALT  19  --   ALKPHOS 56  --   BILITOT 0.6  --    ------------------------------------------------------------------------------------------------------------------  ------------------------------------------------------------------------------------------------------------------ GFR: Estimated Creatinine Clearance: 14.6 mL/min (A) (by C-G formula based on SCr of 5.5 mg/dL (H)). Liver Function Tests: Recent Labs  Lab 03/26/19 0100  AST 25  ALT 19  ALKPHOS 56  BILITOT 0.6  PROT 7.4  ALBUMIN 4.2    --------------------------------------------------------------------------------------------------------------- Urine analysis: No results found for: COLORURINE, APPEARANCEUR, LABSPEC, PHURINE, GLUCOSEU, HGBUR, BILIRUBINUR, KETONESUR, PROTEINUR, UROBILINOGEN, NITRITE, LEUKOCYTESUR    Imaging Results:    DG Chest Portable 1 View  Result Date: 03/26/2019 CLINICAL DATA:  Fall, near syncope EXAM: PORTABLE CHEST 1 VIEW COMPARISON:  None. FINDINGS: There are few streaky basilar opacities with more patchy peripheral airspace disease. No pneumothorax. No effusion. The aorta is calcified. The remaining cardiomediastinal contours are unremarkable. No  acute osseous or soft tissue abnormality. Degenerative changes are present in the imaged spine and shoulders. IMPRESSION: 1. Streaky basilar opacities favoring atelectasis though more patchy peripheral airspace disease could suggest developing infection as well. 2. No acute traumatic findings in the chest. Electronically Signed   By: Lovena Le M.D.   On: 03/26/2019 02:33    My personal review of EKG: Rhythm NSR, no ST changes.   Assessment & Plan:    Active Problems:   AKI (acute kidney injury) ([place])   1. Acute kidney injury-unknown baseline, likely acute kidney injury.  Patient has been taking HCTZ, lisinopril at home.  Likely hypotension induced ischemic ATN.  Will hold HCTZ, lisinopril.  Start IV normal saline at 100 mL/h.  Consult nephrology in  a.m.  Will obtain urine sodium, urine creatinine.  Obtain renal ultrasound in a.m.  2. Diabetes mellitus type 2-continue NPH/NovoLog 70/30, 60 units twice a day, initiate sliding scale insulin with NovoLog.  Hold Metformin due to renal dysfunction as above.  3. Hyperlipidemia-continue Zocor   DVT Prophylaxis-   Heparin  AM Labs Ordered, also please review Full Orders  Family Communication: Admission, patients condition and plan of care including tests being ordered have been discussed with the patient  who indicate understanding and agree with the plan and Code Status.  Code Status: Full code  Admission status: Inpatient: Based on patients clinical presentation and evaluation of above clinical data, I have made determination that patient meets Inpatient criteria at this time.  Time spent in minutes : 60 minutes   Kaylinn Dedic S Yarelis Ambrosino M.D

## 2019-03-26 NOTE — ED Notes (Signed)
Pt refuses blood pressure

## 2019-03-26 NOTE — ED Notes (Signed)
Patient ambulatory to bathroom.

## 2019-03-26 NOTE — ED Provider Notes (Signed)
Emergency Department Provider Note   I have reviewed the triage vital signs and the nursing notes.   HISTORY  Chief Complaint Near Syncope   HPI Edward Boyd is a 74 y.o. male who presents the emerge department today for near syncope.  Patient states that he has not felt lightheaded today with movement.  An episode where he got very weak and fell to the ground.  Has some right-sided chest pain.  Unsure when he last urinated.  He states no clots happened to him previously.  No recent illnesses.  Is been eating drinking normally.  No diarrhea or constipation.  No nausea or vomiting.  No rashes.  No other associated symptoms.   No other associated or modifying symptoms.    Past Medical History:  Diagnosis Date  . Cataract    righ teye removed with lens implant, left eye cataract forming   . Diabetes mellitus without complication ([place])   . Hyperlipidemia   . Hypertension   . Sleep apnea    wears cpap     There are no problems to display for this patient.   Past Surgical History:  Procedure Laterality Date  . COLONOSCOPY    . CYSTECTOMY     back     Current Outpatient Rx  . Order #: [id number] Class: Historical Med  . Order #: [id number] Class: Historical Med  . Order #: [id number] Class: Print  . Order #: [id number] Class: Historical Med  . Order #: [id number] Class: Historical Med  . Order #: [id number] Class: Historical Med  . Order #: [id number] Class: Historical Med  . Order #: [id number] Class: Historical Med  . Order #: [id number] Class: Historical Med  . Order #: [id number] Class: Historical Med  . Order #: [id number] Class: Historical Med  . Order #[id number] Class: Historical Med    Allergies Penicillins  Family History  Problem Relation Age of Onset  . Colon cancer Neg Hx   . Colon polyps Neg Hx   . Esophageal cancer Neg Hx   . Rectal cancer Neg Hx   . Stomach cancer Neg Hx     Social History Social History   Tobacco Use  . Smoking status: Never Smoker  . Smokeless  tobacco: Never Used  Substance Use Topics  . Alcohol use: Yes    Comment: very rare   . Drug use: Never    Review of Systems  All other systems negative except as documented in the HPI. All pertinent positives and negatives as reviewed in the HPI. ____________________________________________   PHYSICAL EXAM:  VITAL SIGNS: ED Triage Vitals  Enc Vitals Group     BP 03/25/19 2300 (!) 101/52     Pulse Rate 03/25/19 2300 86     Resp 03/25/19 2300 14     Temp 03/25/19 2304 98.2 F (36.8 C)     Temp Source 03/25/19 2304 Oral     SpO2 03/25/19 2300 98 %     Weight 03/25/19 2259 250 lb (113.4 kg)     Height 03/25/19 2259 5\' 10"  (1.778 m)    Constitutional: Alert and oriented. Well appearing and in no acute distress. Eyes: Conjunctivae are normal. PERRL. EOMI. Head: Atraumatic. Nose: No congestion/rhinnorhea. Mouth/Throat: Mucous membranes are moist.  Oropharynx non-erythematous. Neck: No stridor.  No meningeal signs.   Cardiovascular: Normal rate, regular rhythm. Good peripheral circulation. Grossly normal heart sounds.   Respiratory: Normal respiratory effort.  No retractions. Lungs CTAB. Gastrointestinal: Soft and nontender. No distention.  Musculoskeletal: No lower extremity tenderness nor edema. No gross deformities of  extremities. Neurologic:  Normal speech and language. No gross focal neurologic deficits are appreciated.  Skin:  Skin is warm, dry and intact. No rash noted.  ____________________________________________   LABS (all labs ordered are listed, but only abnormal results are displayed)  Labs Reviewed  CBC WITH DIFFERENTIAL/PLATELET - Abnormal; Notable for the following components:      Result Value   WBC 13.6 (*)    Neutro Abs 10.2 (*)    Monocytes Absolute 1.4 (*)    All other components within normal limits  COMPREHENSIVE METABOLIC PANEL - Abnormal; Notable for the following components:   Sodium 134 (*)    CO2 20 (*)    Glucose, Bld 196 (*)    BUN 56  (*)    Creatinine, Ser 6.09 (*)    GFR calc non Af Amer 8 (*)    GFR calc Af Amer 10 (*)    Anion gap 16 (*)    All other components within normal limits  I-STAT CREATININE, ED - Abnormal; Notable for the following components:   Creatinine, Ser 5.50 (*)    All other components within normal limits  SARS CORONAVIRUS 2 (TAT 6-24 HRS)  MAGNESIUM  BRAIN NATRIURETIC PEPTIDE  URINALYSIS, ROUTINE W REFLEX MICROSCOPIC  I-STAT CHEM 8, ED  TROPONIN I (HIGH SENSITIVITY)  TROPONIN I (HIGH SENSITIVITY)   ____________________________________________  EKG   EKG Interpretation  Date/Time:  Thursday March 25 2019 23:00:46 EST Ventricular Rate:  85 PR Interval:    QRS Duration: 155 QT Interval:  405 QTC Calculation: 482 R Axis:   151 Text Interpretation: Sinus rhythm Right bundle branch block No old tracing to compare Confirmed by Merrily Pew [phone]) on 03/26/2019 1:45:13 AM       ____________________________________________  RADIOLOGY  DG Chest Portable 1 View  Result Date: 03/26/2019 CLINICAL DATA:  Fall, near syncope EXAM: PORTABLE CHEST 1 VIEW COMPARISON:  None. FINDINGS: There are few streaky basilar opacities with more patchy peripheral airspace disease. No pneumothorax. No effusion. The aorta is calcified. The remaining cardiomediastinal contours are unremarkable. No acute osseous or soft tissue abnormality. Degenerative changes are present in the imaged spine and shoulders. IMPRESSION: 1. Streaky basilar opacities favoring atelectasis though more patchy peripheral airspace disease could suggest developing infection as well. 2. No acute traumatic findings in the chest. Electronically Signed   By: Lovena Le M.D.   On: 03/26/2019 02:33    ____________________________________________   PROCEDURES  Procedure(s) performed:   .Critical Care Performed by: Merrily Pew, MD Authorized by: Merrily Pew, MD   Critical care provider statement:    Critical care time (minutes):   45   Critical care was necessary to treat or prevent imminent or life-threatening deterioration of the following conditions:  Renal failure   Critical care was time spent personally by me on the following activities:  Discussions with consultants, evaluation of patient's response to treatment, examination of patient, ordering and performing treatments and interventions, ordering and review of laboratory studies, ordering and review of radiographic studies, pulse oximetry, re-evaluation of patient's condition, obtaining history from patient or surrogate and review of old charts  ____________________________________________   INITIAL IMPRESSION / [place] / ED COURSE  Patient with new onset renal failure.  Unknown last urination.  He has no urine in his bladder.  Patient has no known history of kidney issues.  Patient will be admitted for further management work-up  Pertinent labs & imaging results that were available during my care of the patient were  reviewed by me and considered in my medical decision making (see chart for details).  A medical screening exam was performed and I feel the patient has had an appropriate workup for their chief complaint at this time and likelihood of emergent condition existing is low. They have been counseled on decision, discharge, follow up and which symptoms necessitate immediate return to the emergency department. They or their family verbally stated understanding and agreement with plan and discharged in stable condition.   ____________________________________________  FINAL CLINICAL IMPRESSION(S) / ED DIAGNOSES  Final diagnoses:  Near syncope  Renal failure, unspecified chronicity    MEDICATIONS GIVEN DURING THIS VISIT:  Medications  lactated ringers bolus 1,000 mL (0 mLs Intravenous Stopped 03/26/19 0255)  lactated ringers bolus 1,000 mL (1,000 mLs Intravenous New Bag/Given 03/26/19 0256)     NEW OUTPATIENT MEDICATIONS STARTED DURING THIS  VISIT:  New Prescriptions   No medications on file    Note:  This note was prepared with assistance of Dragon voice recognition software. Occasional wrong-word or sound-a-like substitutions may have occurred due to the inherent limitations of voice recognition software.   Mahi Zabriskie, Corene Cornea, MD 03/26/19 [phone]

## 2019-03-26 NOTE — ED Notes (Signed)
Pt given both water and diet soda   He reports he is bored and appreciates conversation

## 2019-03-26 NOTE — ED Notes (Signed)
Hospital bed provided to pt by [facility]

## 2019-03-26 NOTE — ED Notes (Signed)
Pt has vitals due q4

## 2019-03-26 NOTE — ED Notes (Signed)
Have checked on patient. NAD.

## 2019-03-26 NOTE — Consult Note (Signed)
Reason for Consult: AKI Referring Physician: Posey Pronto, MD  Edward Boyd is an 74 y.o. male.  HPI: Edward Boyd has a PMH significant for DM, HTN, obesity, and OSA on CPAP and receives his care at the [facility].  He presented to [facility] on 03/25/19 after he had a near syncopal event at home.  He reported having dizziness and lightheadedness yesterday that persisted until he "fell out" at home.  His home BP was low with SBP's in the 80's.  He also had some right-sided chest pain.  He was brought to the ED and was relatively hypotensive at 101/52.  Labs revealed BUN/Cr of 56/6.09, Na 134, CO2 20, Hgb 13.9, WBC 13.6, and BNP 45.  His troponin I was 7.  He was admitted for IVF's due to his hypotension and AKI.   We were consulted to further evaluate and manage his AKI.    Of note, he was on lisinopril and metformin prior to admission.  He denies ever being told by his PCP that he had any issues with CKD.  He denies any N/V/D, dysuria, pyuria, hematuria, urgency, frequency or retention but has noticed decreased UOP over the last 24 hours.  He also denies any NSAIDs or COX-II I's.  He is feeling better today and his covid test was negative.  The trend in Scr is seen below and we do not have any other labs for comparison and he does not know his baseline Scr.   Trend in Creatinine: Creatinine, Ser  Date/Time Value Ref Range Status  03/26/2019 05:30 AM 5.79 (H) 0.61 - 1.24 mg/dL Final  03/26/2019 02:54 AM 5.50 (H) 0.61 - 1.24 mg/dL Final  03/26/2019 01:00 AM 6.09 (H) 0.61 - 1.24 mg/dL Final    PMH:   Past Medical History:  Diagnosis Date  . Cataract    righ teye removed with lens implant, left eye cataract forming   . Diabetes mellitus without complication ([place])   . Hyperlipidemia   . Hypertension   . Sleep apnea    wears cpap     PSH:   Past Surgical History:  Procedure Laterality Date  . COLONOSCOPY    . CYSTECTOMY     back     Allergies:  Allergies  Allergen Reactions  . Penicillins  Rash    Did it involve swelling of the face/tongue/throat, SOB, or low BP? No Did it involve sudden or severe rash/hives, skin peeling, or any reaction on the inside of your mouth or nose? Yes Did you need to seek medical attention at a hospital or doctor's office? Unknown When did it last happen? Childhood  If all above answers are "NO", may proceed with cephalosporin use.     Medications:   Prior to Admission medications   Medication Sig Start Date End Date Taking? Authorizing Provider  aspirin EC 81 MG tablet Take 81 mg by mouth daily.   Yes [provider]  cholecalciferol (VITAMIN D) 400 units TABS tablet Take 800 Units by mouth daily.    Yes [provider]  gabapentin (NEURONTIN) 300 MG capsule Take 1 capsule (300 mg total) by mouth 4 (four) times daily. Patient taking differently: Take 300 mg by mouth 3 (three) times daily.  10/31/17  Yes Evelina Bucy, DPM  hydrochlorothiazide (HYDRODIURIL) 25 MG tablet Take 25 mg by mouth daily.   Yes [provider]  insulin NPH-regular Human (70-30) 100 UNIT/ML injection Inject 60 Units into the skin 2 (two) times daily before a meal.  Yes [provider]  lisinopril (PRINIVIL,ZESTRIL) 40 MG tablet Take 40 mg by mouth daily.   Yes [provider]  Magnesium Gluconate 250 MG TABS Take 250 mg by mouth daily.    Yes [provider]  metFORMIN (GLUCOPHAGE) 1000 MG tablet Take 1,000 mg by mouth 2 (two) times daily with a meal.   Yes [provider]  simvastatin (ZOCOR) 40 MG tablet Take 40 mg by mouth daily.    Yes [provider]  vitamin B-12 (CYANOCOBALAMIN) 1000 MCG tablet Take 1,000 mcg by mouth daily.   Yes [provider]    Inpatient medications: . aspirin EC  81 mg Oral Daily  . atorvastatin  40 mg Oral q1800  . heparin  5,000 Units Subcutaneous Q8H  . insulin aspart  0-9 Units Subcutaneous TID WC  . insulin aspart protamine- aspart  60 Units  Subcutaneous BID WC    Discontinued Meds:   Medications Discontinued During This Encounter  Medication Reason  . insulin regular (NOVOLIN R,HUMULIN R) 100 units/mL injection Discontinued by provider  . sildenafil (REVATIO) 20 MG tablet Discontinued by provider    Social History:  reports that he has never smoked. He has never used smokeless tobacco. He reports current alcohol use. He reports that he does not use drugs.  Family History:   Family History  Problem Relation Age of Onset  . Colon cancer Neg Hx   . Colon polyps Neg Hx   . Esophageal cancer Neg Hx   . Rectal cancer Neg Hx   . Stomach cancer Neg Hx     Pertinent items are noted in HPI. Weight change:   Intake/Output Summary (Last 24 hours) at 03/26/2019 1256 Last data filed at 03/26/2019 0451 Gross per 24 hour  Intake 2000 ml  Output -  Net 2000 ml   BP 127/79 (BP Location: Right Arm)   Pulse (!) 59   Temp 98.2 F (36.8 C) (Oral)   Resp 14   Ht 5\' 10"  (1.778 m)   Wt 113.4 kg   SpO2 96%   BMI 35.87 kg/m  Vitals:   03/26/19 0500 03/26/19 0515 03/26/19 0700 03/26/19 1103  BP: 121/68 (!) 106/46 (!) 135/59 127/79  Pulse: 80 75 74 (!) 59  Resp: 14 15  14   Temp:      TempSrc:      SpO2: 93% 96% 95% 96%  Weight:      Height:         General appearance: alert, cooperative, no distress and moderately obese Head: Normocephalic, without obvious abnormality, atraumatic Eyes: negative findings: lids and lashes normal, conjunctivae and sclerae normal and corneas clear Resp: clear to auscultation bilaterally Cardio: regular rate and rhythm, S1, S2 normal, no murmur, click, rub or gallop GI: soft, non-tender; bowel sounds normal; no masses,  no organomegaly Extremities: extremities normal, atraumatic, no cyanosis or edema  Labs: Basic Metabolic Panel: Recent Labs  Lab 03/26/19 0100 03/26/19 0254 03/26/19 0530  NA 134*  --  134*  K 4.1  --  3.9  CL 98  --  99  CO2 20*  --  23  GLUCOSE 196*  --  170*  BUN  56*  --  55*  CREATININE 6.09* 5.50* 5.79*  ALBUMIN 4.2  --  3.7  CALCIUM 9.6  --  9.1   Liver Function Tests: Recent Labs  Lab 03/26/19 0100 03/26/19 0530  AST 25 22  ALT 19 16  ALKPHOS 56 50  BILITOT 0.6 0.6  PROT 7.4 6.7  ALBUMIN 4.2 3.7   No results for input(s): LIPASE, AMYLASE in the last 168 hours. No results for input(s): AMMONIA in the last 168 hours. CBC: Recent Labs  Lab 03/26/19 0100 03/26/19 0530  WBC 13.6* 9.3  NEUTROABS 10.2*  --   HGB 13.9 12.9*  HCT 43.5 40.3  MCV 92.0 92.2  PLT 236 214   PT/INR: @LABRCNTIP (inr:5) Cardiac Enzymes: ) Recent Labs  Lab 03/26/19 1022  CKTOTAL 391   CBG: Recent Labs  Lab 03/26/19 0902  GLUCAP 150*    Iron Studies: No results for input(s): IRON, TIBC, TRANSFERRIN, FERRITIN in the last 168 hours.  Xrays/Other Studies: US RENAL  Result Date: 03/26/2019 CLINICAL DATA:  Acute renal failure EXAM: RENAL / URINARY TRACT ULTRASOUND COMPLETE COMPARISON:  None. FINDINGS: Right Kidney: Renal measurements: 12.7 x 6.7 x 6.5 cm = volume: 290.2 mL . Echogenicity within normal limits. No mass or hydronephrosis visualized. Left Kidney: Renal measurements: 12.7 x 5.8 x 7.0 cm = volume: 269.2 mL. Echogenicity within normal limits. No mass or hydronephrosis visualized. Bladder: Appears normal for degree of bladder distention. Other: None. IMPRESSION: No hydronephrosis. Electronically Signed   By: Lovey Newcomer M.D.   On: 03/26/2019 08:29   DG Chest Portable 1 View  Result Date: 03/26/2019 CLINICAL DATA:  Fall, near syncope EXAM: PORTABLE CHEST 1 VIEW COMPARISON:  None. FINDINGS: There are few streaky basilar opacities with more patchy peripheral airspace disease. No pneumothorax. No effusion. The aorta is calcified. The remaining cardiomediastinal contours are unremarkable. No acute osseous or soft tissue abnormality. Degenerative changes are present in the imaged spine and shoulders. IMPRESSION: 1. Streaky basilar opacities favoring  atelectasis though more patchy peripheral airspace disease could suggest developing infection as well. 2. No acute traumatic findings in the chest. Electronically Signed   By: Lovena Le M.D.   On: 03/26/2019 02:33     Assessment/Plan: 1.  AKI (possibly ischemic ATN in setting of low BP and concomitant ACE inhibition) vs chronic as we do not have any prior labs for comparison but does have longstanding history of DM and HTN.  Renal US without obstruction and normally appearing kidneys.  UA with small blood and trace leukocytes.  Slight improvement after IVF's 1. Check serologies, SPEP/UPEP and follow renal function 2. No indication for dialysis at this time 3. Continue to follow UOP and Scr 4. Stop metformin and ACE-inhibitor  5. Try to obtain outpatient records from [place] 2. Presyncope- possibly due to hypovolemia, although no history of N/V/D.  Feeling better 3. Right sided chest pain- CXR without overt signs of infection and low troponin I and EKG with NSR and RBBB. Per primary 4. HTN- was hypotensive at home and now improved. 5. DM- stable 6. HLD- on zocor and normal CK level   Edward Boyd A Jeananne Bedwell 03/26/2019, 12:56 PM

## 2019-03-26 NOTE — ED Notes (Signed)
Pt distressed and angry that he has not been roomed   Remarks that he wants to go home   [facility] in to speak with pt

## 2019-03-27 DIAGNOSIS — E669 Obesity, unspecified: Secondary | ICD-10-CM | POA: Diagnosis present

## 2019-03-27 DIAGNOSIS — E119 Type 2 diabetes mellitus without complications: Secondary | ICD-10-CM

## 2019-03-27 DIAGNOSIS — E785 Hyperlipidemia, unspecified: Secondary | ICD-10-CM | POA: Diagnosis present

## 2019-03-27 DIAGNOSIS — I10 Essential (primary) hypertension: Secondary | ICD-10-CM | POA: Diagnosis present

## 2019-03-27 LAB — RENAL FUNCTION PANEL
Albumin: 3.3 g/dL — ABNORMAL LOW (ref 3.5–5.0)
Anion gap: 7 (ref 5–15)
BUN: 53 mg/dL — ABNORMAL HIGH (ref 8–23)
CO2: 24 mmol/L (ref 22–32)
Calcium: 8.7 mg/dL — ABNORMAL LOW (ref 8.9–10.3)
Chloride: 104 mmol/L (ref 98–111)
Creatinine, Ser: 3.28 mg/dL — ABNORMAL HIGH (ref 0.61–1.24)
GFR calc Af Amer: 20 mL/min — ABNORMAL LOW (ref >60)
GFR calc non Af Amer: 17 mL/min — ABNORMAL LOW (ref >60)
Glucose, Bld: 228 mg/dL — ABNORMAL HIGH (ref 70–99)
Phosphorus: 3.3 mg/dL (ref 2.5–4.6)
Potassium: 3.9 mmol/L (ref 3.5–5.1)
Sodium: 135 mmol/L (ref 135–145)

## 2019-03-27 LAB — C4 COMPLEMENT: Complement C4, Body Fluid: 26 mg/dL (ref 12–38)

## 2019-03-27 LAB — BASIC METABOLIC PANEL
Anion gap: 10 (ref 5–15)
BUN: 49 mg/dL — ABNORMAL HIGH (ref 8–23)
CO2: 26 mmol/L (ref 22–32)
Calcium: 9.4 mg/dL (ref 8.9–10.3)
Chloride: 102 mmol/L (ref 98–111)
Creatinine, Ser: 2.5 mg/dL — ABNORMAL HIGH (ref 0.61–1.24)
GFR calc Af Amer: 28 mL/min — ABNORMAL LOW (ref >60)
GFR calc non Af Amer: 24 mL/min — ABNORMAL LOW (ref >60)
Glucose, Bld: 113 mg/dL — ABNORMAL HIGH (ref 70–99)
Potassium: 4.6 mmol/L (ref 3.5–5.1)
Sodium: 138 mmol/L (ref 135–145)

## 2019-03-27 LAB — ANTISTREPTOLYSIN O TITER: ASO: 61 IU/mL (ref 0.0–200.0)

## 2019-03-27 LAB — CBG MONITORING, ED: Glucose-Capillary: 224 mg/dL — ABNORMAL HIGH (ref 70–99)

## 2019-03-27 LAB — GLUCOSE, CAPILLARY: Glucose-Capillary: 109 mg/dL — ABNORMAL HIGH (ref 70–99)

## 2019-03-27 LAB — ANTI-DNA ANTIBODY, DOUBLE-STRANDED: ds DNA Ab: <1 IU/mL (ref 0–9)

## 2019-03-27 LAB — C3 COMPLEMENT: C3 Complement: 139 mg/dL (ref 82–167)

## 2019-03-27 MED ORDER — ASPIRIN EC 81 MG PO TBEC: 81.0000 mg | DELAYED_RELEASE_TABLET | Freq: Every day | ORAL | 2 refills | Status: AC

## 2019-03-27 MED ORDER — SODIUM CHLORIDE 0.9 % IV SOLN: INTRAVENOUS | Status: DC

## 2019-03-27 NOTE — Discharge Instructions (Signed)
1)Follow-up with primary care physician and nephrologist at [facility] within a week for recheck and reevaluation and repeat blood work including CBC and BMP blood test 2)Avoid ibuprofen/Advil/Aleve/Motrin/Goody Powders/Naproxen/BC powders/Meloxicam/Diclofenac/Indomethacin and other Nonsteroidal anti-inflammatory medications as these will make you more likely to bleed and can cause stomach ulcers, can also cause Kidney problems.  3)Hold Lisinopril and Hold Metformin and Hold Hydrochlorthiazide

## 2019-03-27 NOTE — Progress Notes (Signed)
[facility] Progress Note    74 y/o man DM HTN OSA followed at [facility] here with near syncope with SBP in nthe 80's and rt sided CP. He doesn't recall ever being told he has renal issues -> found to be in AKI in the ED. Was on lisinopril and metformin prior to admission.  Assessment/ Plan:   1.  AKI (possibly ischemic ATN in setting of low BP and concomitant ACE inhibition) vs chronic as we do not have any prior labs for comparison but does have longstanding history of DM and HTN.  Renal US without obstruction and normally appearing kidneys.  UA with small blood and trace leukocytes.  Slight improvement after IVF's 1. Check serologies, SPEP/UPEP and follow renal function -> can f/u as outpt. Renal function already improving despite not getting fluids till late. He's also feeling much better. 2. Stop metformin and ACE-inhibitor -> may start as outpt 3. Try to obtain outpatient records from [place] he has f/u appt with [facility] in a few weeks.  He has no h/o renal issues; with significant improvement in renal function, minimal proteinuria and inactive sediment most likely AKI related to hemodynamics (low BP which has resolved). From renal standpoint he is ok for d/c with f/u at [facility].  2. Presyncope- possibly due to hypovolemia, although no history of N/V/D.  Feeling better 3. Right sided chest pain- CXR without overt signs of infection and low troponin I and EKG with NSR and RBBB. Per primary 4. HTN- was hypotensive at home and now improved. 5. DM- stable 6. HLD- on zocor and normal CK level  Subjective:   Feeling much better. Denies any more dizziness; denies f/c/n/v/dyspnea/ CP.   Objective:   BP (!) 135/59 (BP Location: Right Arm)   Pulse 83   Temp 99 F (37.2 C) (Oral)   Resp 19   Ht 5\' 10"  (1.778 m)   Wt 113.4 kg   SpO2 98%   BMI 35.87 kg/m   Intake/Output Summary (Last 24 hours) at 03/27/2019 1207 Last data filed at  03/27/2019 [id number] Gross per 24 hour  Intake 1480 ml  Output --  Net 1480 ml   Weight change:   Physical Exam: General appearance: NAD Head: NCAT Resp: clear to auscultation bilaterally Cardio: regular rate and rhythm, S1, S2 normal, no murmur, click, rub or gallop GI: soft, non-tender; bowel sounds normal; no masses,  no organomegaly Extremities: extremities normal, atraumatic, no cyanosis or edema  Imaging: US RENAL  Result Date: 03/26/2019 CLINICAL DATA:  Acute renal failure EXAM: RENAL / URINARY TRACT ULTRASOUND COMPLETE COMPARISON:  None. FINDINGS: Right Kidney: Renal measurements: 12.7 x 6.7 x 6.5 cm = volume: 290.2 mL . Echogenicity within normal limits. No mass or hydronephrosis visualized. Left Kidney: Renal measurements: 12.7 x 5.8 x 7.0 cm = volume: 269.2 mL. Echogenicity within normal limits. No mass or hydronephrosis visualized. Bladder: Appears normal for degree of bladder distention. Other: None. IMPRESSION: No hydronephrosis. Electronically Signed   By: Lovey Newcomer M.D.   On: 03/26/2019 08:29   DG Chest Portable 1 View  Result Date: 03/26/2019 CLINICAL DATA:  Fall, near syncope EXAM: PORTABLE CHEST 1 VIEW COMPARISON:  None. FINDINGS: There are few streaky basilar opacities with more patchy peripheral airspace disease. No pneumothorax. No effusion. The aorta is calcified. The remaining cardiomediastinal contours are unremarkable. No acute osseous or soft tissue abnormality. Degenerative changes are present in the imaged spine and shoulders. IMPRESSION: 1. Streaky basilar opacities favoring  atelectasis though more patchy peripheral airspace disease could suggest developing infection as well. 2. No acute traumatic findings in the chest. Electronically Signed   By: Lovena Le M.D.   On: 03/26/2019 02:33    Labs: BMET Recent Labs  Lab 03/26/19 0100 03/26/19 0254 03/26/19 0530 03/27/19 0338  NA 134*  --  134* 135  K 4.1  --  3.9 3.9  CL 98  --  99 104  CO2 20*  --  23 24   GLUCOSE 196*  --  170* 228*  BUN 56*  --  55* 53*  CREATININE 6.09* 5.50* 5.79* 3.28*  CALCIUM 9.6  --  9.1 8.7*  PHOS  --   --   --  3.3   CBC Recent Labs  Lab 03/26/19 0100 03/26/19 0530  WBC 13.6* 9.3  NEUTROABS 10.2*  --   HGB 13.9 12.9*  HCT 43.5 40.3  MCV 92.0 92.2  PLT 236 214    Medications:    . aspirin EC  81 mg Oral Daily  . atorvastatin  40 mg Oral q1800  . heparin  5,000 Units Subcutaneous Q8H  . influenza vaccine adjuvanted  0.5 mL Intramuscular Tomorrow-1000  . insulin aspart  0-9 Units Subcutaneous TID WC  . insulin aspart protamine- aspart  60 Units Subcutaneous BID WC      Otelia Santee, MD 03/27/2019, 12:07 PM

## 2019-03-27 NOTE — Discharge Summary (Signed)
Edward Boyd, is a 74 y.o. male  DOB 1945/11/16  MRN [id number].  Admission date:  03/25/2019  Admitting Physician  Oswald Hillock, MD  Discharge Date:  03/27/2019   Primary MD  Patient, No Pcp Per  Recommendations for primary care physician for things to follow:    1)Follow-up with primary care physician and nephrologist at [facility] within a week for recheck and reevaluation and repeat blood work including CBC and BMP blood test 2)Avoid ibuprofen/Advil/Aleve/Motrin/Goody Powders/Naproxen/BC powders/Meloxicam/Diclofenac/Indomethacin and other Nonsteroidal anti-inflammatory medications as these will make you more likely to bleed and can cause stomach ulcers, can also cause Kidney problems.  3)Hold Lisinopril and Hold Metformin and Hold Hydrochlorthiazide  Admission Diagnosis  AKI (acute kidney injury) ([place]) [N17.9] Near syncope [R55] Renal failure, unspecified chronicity [N19]   Discharge Diagnosis  AKI (acute kidney injury) ([place]) [N17.9] Near syncope [R55] Renal failure, unspecified chronicity [N19]    Principal Problem:   AKI Vs CKD--Baseline creatinine unknown Active Problems:   Diabetes (HCC)   HTN (hypertension)   HLD (hyperlipidemia)   Obesity (BMI 30-39.9)      Past Medical History:  Diagnosis Date  . Cataract    righ teye removed with lens implant, left eye cataract forming   . Diabetes mellitus without complication ([place])   . Hyperlipidemia   . Hypertension   . Sleep apnea    wears cpap     Past Surgical History:  Procedure Laterality Date  . COLONOSCOPY    . CYSTECTOMY     back        HPI  from the history and physical done on the day of admission:    Edward Boyd  is a 74 y.o. male, with history of diabetes mellitus type 2, hypertension, sleep apnea, hyperlipidemia came to ED with complaints of dizziness, near syncope.  Patient says that he started feeling dizzy  yesterday morning and felt very weak and fell to ground.  Patient said that he has been eating and drinking normally.  Yesterday he did not take his blood pressure medications as blood pressure was low. In the ED patient was found to be in acute kidney injury with creatinine 6.09, repeat labs showed creatinine 5.50.  Patient takes HCTZ, lisinopril, Metformin at home. He denies nausea vomiting or diarrhea. Denies chest pain or shortness of breath. Denies dysuria. Denies prostate problems. Denies history of CAD or seizures. Denies abdominal pain    Hospital Course:     1)Acute kidney injury-unknown baseline, likely acute kidney injury.  PTA patient was on HCTZ, lisinopril and Metformin.  Likely hypotension induced ischemic ATN.   -Renal ultrasound without obstructive uropathy, no significant medical renal kidney disease noted, -Discussed with Dr. Augustin Coupe from nephrology service, okay to discharge home, creatinine down to 2.5 from 6.09 on admission, baseline creatinine unknown, patient will follow up with physician at [facility] in [place] for repeat BMP within a week -Will not restart lisinopril or HCTZ at this time  2)Diabetes mellitus type 2- -okay to continue NPH/NovoLog 70/30, continue  to hold Metformin due to kidney concerns\  3)Hyperlipidemia-continue Zocor and asa  Discharge Condition: Stable  Follow UP--- nephrologist at [facility] in [place] obtained -nephrology  Diet and Activity recommendation:  As advised  Discharge Instructions    Discharge Instructions    Call MD for:  difficulty breathing, headache or visual disturbances   Complete by: As directed    Call MD for:  extreme fatigue   Complete by: As directed    Call MD for:  persistant dizziness or light-headedness   Complete by: As directed    Call MD for:  persistant nausea and vomiting   Complete by: As directed    Call MD for:  severe uncontrolled pain   Complete by: As directed    Call MD for:   temperature >100.4   Complete by: As directed    Diet - low sodium heart healthy   Complete by: As directed    Diet Carb Modified   Complete by: As directed    Discharge instructions   Complete by: As directed    1)Follow-up with primary care physician and nephrologist at [facility] within a week for recheck and reevaluation and repeat blood work including CBC and BMP blood test 2)Avoid ibuprofen/Advil/Aleve/Motrin/Goody Powders/Naproxen/BC powders/Meloxicam/Diclofenac/Indomethacin and other Nonsteroidal anti-inflammatory medications as these will make you more likely to bleed and can cause stomach ulcers, can also cause Kidney problems.  3)Hold Lisinopril and Hold Metformin and Hold Hydrochlorthiazide   Increase activity slowly   Complete by: As directed         Discharge Medications     Allergies as of 03/27/2019      Reactions   Penicillins Rash   Did it involve swelling of the face/tongue/throat, SOB, or low BP? No Did it involve sudden or severe rash/hives, skin peeling, or any reaction on the inside of your mouth or nose? Yes Did you need to seek medical attention at a hospital or doctor's office? Unknown When did it last happen? Childhood  If all above answers are "NO", may proceed with cephalosporin use.      Medication List    STOP taking these medications   hydrochlorothiazide 25 MG tablet Commonly known as: HYDRODIURIL   lisinopril 40 MG tablet Commonly known as: ZESTRIL   metFORMIN 1000 MG tablet Commonly known as: GLUCOPHAGE     TAKE these medications   aspirin EC 81 MG tablet Take 1 tablet (81 mg total) by mouth daily with breakfast. What changed: when to take this   cholecalciferol 10 MCG (400 UNIT) Tabs tablet Commonly known as: VITAMIN D3 Take 800 Units by mouth daily.   gabapentin 300 MG capsule Commonly known as: NEURONTIN Take 1 capsule (300 mg total) by mouth 4 (four) times daily. What changed: when to take this   insulin  NPH-regular Human (70-30) 100 UNIT/ML injection Inject 60 Units into the skin 2 (two) times daily before a meal.   Magnesium Gluconate 250 MG Tabs Take 250 mg by mouth daily.   simvastatin 40 MG tablet Commonly known as: ZOCOR Take 40 mg by mouth daily.   vitamin B-12 1000 MCG tablet Commonly known as: CYANOCOBALAMIN Take 1,000 mcg by mouth daily.       Major procedures and Radiology Reports - PLEASE review detailed and final reports for all details, in brief -   US RENAL  Result Date: 03/26/2019 CLINICAL DATA:  Acute renal failure EXAM: RENAL / URINARY TRACT ULTRASOUND COMPLETE COMPARISON:  None. FINDINGS: Right Kidney: Renal  measurements: 12.7 x 6.7 x 6.5 cm = volume: 290.2 mL . Echogenicity within normal limits. No mass or hydronephrosis visualized. Left Kidney: Renal measurements: 12.7 x 5.8 x 7.0 cm = volume: 269.2 mL. Echogenicity within normal limits. No mass or hydronephrosis visualized. Bladder: Appears normal for degree of bladder distention. Other: None. IMPRESSION: No hydronephrosis. Electronically Signed   By: Lovey Newcomer M.D.   On: 03/26/2019 08:29   DG Chest Portable 1 View  Result Date: 03/26/2019 CLINICAL DATA:  Fall, near syncope EXAM: PORTABLE CHEST 1 VIEW COMPARISON:  None. FINDINGS: There are few streaky basilar opacities with more patchy peripheral airspace disease. No pneumothorax. No effusion. The aorta is calcified. The remaining cardiomediastinal contours are unremarkable. No acute osseous or soft tissue abnormality. Degenerative changes are present in the imaged spine and shoulders. IMPRESSION: 1. Streaky basilar opacities favoring atelectasis though more patchy peripheral airspace disease could suggest developing infection as well. 2. No acute traumatic findings in the chest. Electronically Signed   By: Lovena Le M.D.   On: 03/26/2019 02:33    Micro Results     Recent Results (from the past 240 hour(s))  SARS CORONAVIRUS 2 (TAT 6-24 HRS) Nasopharyngeal  Nasopharyngeal Swab     Status: None   Collection Time: 03/26/19  3:00 AM   Specimen: Nasopharyngeal Swab  Result Value Ref Range Status   SARS Coronavirus 2 NEGATIVE NEGATIVE Final    Comment: (NOTE) SARS-CoV-2 target nucleic acids are NOT DETECTED. The SARS-CoV-2 RNA is generally detectable in upper and lower respiratory specimens during the acute phase of infection. Negative results do not preclude SARS-CoV-2 infection, do not rule out co-infections with other pathogens, and should not be used as the sole basis for treatment or other patient management decisions. Negative results must be combined with clinical observations, patient history, and epidemiological information. The expected result is Negative. Fact Sheet for Patients: SugarRoll.be Fact Sheet for Healthcare Providers: https://www.woods-mathews.com/ This test is not yet approved or cleared by the [place] FDA and  has been authorized for detection and/or diagnosis of SARS-CoV-2 by FDA under an Emergency Use Authorization (EUA). This EUA will remain  in effect (meaning this test can be used) for the duration of the COVID-19 declaration under Section 56 4(b)(1) of the Act, 21 U.S.C. section 360bbb-3(b)(1), unless the authorization is terminated or revoked sooner. Performed at [facility], [place] [address] [place], [place] [address]        Today   Subjective    Edward Boyd today has no new complaints -Voiding well No fevers or chills  No Nausea, Vomiting or Diarrhea        Patient has been seen and examined prior to discharge   Objective   Blood pressure (!) 135/59, pulse 83, temperature 99 F (37.2 C), temperature source Oral, resp. rate 19, height 5\' 10"  (1.778 m), weight 113.4 kg, SpO2 98 %.   Intake/Output Summary (Last 24 hours) at 03/27/2019 1919 Last data filed at 03/27/2019 1527 Gross per 24 hour  Intake 2627.17 ml  Output --  Net 2627.17 ml     Exam Gen:- Awake Alert, no acute distress  HEENT:- [place].AT, No sclera icterus Neck-Supple Neck,No JVD,.  Lungs-  CTAB , good air movement bilaterally  CV- S1, S2 normal, regular Abd-  +ve B.Sounds, Abd Soft, No tenderness,    Extremity/Skin:- No  edema,   good pulses Psych-affect is appropriate, oriented x3 Neuro-no new focal deficits, no tremors    Data Review  CBC w Diff:  Lab Results  Component Value Date   WBC 9.3 03/26/2019   HGB 12.9 (L) 03/26/2019   HCT 40.3 03/26/2019   PLT 214 03/26/2019   LYMPHOPCT 14 03/26/2019   MONOPCT 10 03/26/2019   EOSPCT 1 03/26/2019   BASOPCT 0 03/26/2019    CMP:  Lab Results  Component Value Date   NA 138 03/27/2019   K 4.6 03/27/2019   CL 102 03/27/2019   CO2 26 03/27/2019   BUN 49 (H) 03/27/2019   CREATININE 2.50 (H) 03/27/2019   PROT 6.7 03/26/2019   ALBUMIN 3.3 (L) 03/27/2019   BILITOT 0.6 03/26/2019   ALKPHOS 50 03/26/2019   AST 22 03/26/2019   ALT 16 03/26/2019  .   Total Discharge time is about 33 minutes  Roxan Hockey M.D on 03/27/2019 at 7:19 PM  Go to [email or web address] -  for contact info  [facility] - Office  [phone]

## 2019-03-27 NOTE — Progress Notes (Signed)
IV's removed and DC instructions reviewed.  Transport by [facility] to short stay for ride home.

## 2019-03-27 NOTE — ED Notes (Signed)
Pt disconnected from monitor per request.

## 2019-03-28 LAB — COMPLEMENT, TOTAL: Compl, Total (CH50): >60 U/mL (ref >41)

## 2019-03-29 LAB — MPO/PR-3 (ANCA) ANTIBODIES
ANCA Proteinase 3: <3.5 U/mL (ref 0.0–3.5)
Myeloperoxidase Abs: <9 U/mL (ref 0.0–9.0)

## 2019-03-29 LAB — PROTEIN ELECTROPHORESIS, SERUM
A/G Ratio: 1.2 (ref 0.7–1.7)
Albumin ELP: 3.7 g/dL (ref 2.9–4.4)
Alpha-1-Globulin: 0.2 g/dL (ref 0.0–0.4)
Alpha-2-Globulin: 1 g/dL (ref 0.4–1.0)
Beta Globulin: 1 g/dL (ref 0.7–1.3)
Gamma Globulin: 0.9 g/dL (ref 0.4–1.8)
Globulin, Total: 3.1 g/dL (ref 2.2–3.9)
Total Protein ELP: 6.8 g/dL (ref 6.0–8.5)

## 2019-03-29 LAB — KAPPA/LAMBDA LIGHT CHAINS
Kappa free light chain: 91.6 mg/L — ABNORMAL HIGH (ref 3.3–19.4)
Kappa, lambda light chain ratio: 2.34 — ABNORMAL HIGH (ref 0.26–1.65)
Lambda free light chains: 39.1 mg/L — ABNORMAL HIGH (ref 5.7–26.3)

## 2019-03-29 LAB — ANTINUCLEAR ANTIBODIES, IFA: ANA Ab, IFA: NEGATIVE

## 2019-03-29 LAB — GLOMERULAR BASEMENT MEMBRANE ANTIBODIES: GBM Ab: 5 units (ref 0–20)

## 2019-03-29 LAB — IMMUNOFIXATION, URINE

## 2020-02-19 ENCOUNTER — Ambulatory Visit
Admission: EM | Admit: 2020-02-19 | Discharge: 2020-02-19 | Disposition: A | Discharge status: Home / Self Care | Payer: No Typology Code available for payment source | Attending: Emergency Medicine | Admitting: Emergency Medicine

## 2020-02-19 ENCOUNTER — Encounter: Payer: Self-pay | Admitting: Emergency Medicine

## 2020-02-19 ENCOUNTER — Other Ambulatory Visit: Payer: Self-pay

## 2020-02-19 DIAGNOSIS — H1031 Unspecified acute conjunctivitis, right eye: Secondary | ICD-10-CM

## 2020-02-19 MED ORDER — OFLOXACIN 0.3 % OP SOLN: 1.0000 [drp] | Freq: Four times a day (QID) | OPHTHALMIC | 0 refills | Status: AC

## 2020-02-19 NOTE — ED Provider Notes (Signed)
[facility]   [id number] 02/19/20 Arrival Time: 7  CC: Red eye  SUBJECTIVE:  MARQUEE FUCHS is a 74 y.o. male who presented to the urgent care for complaint of right eye redness, watery and irritated for the past 1 day.  Denies a precipitating event, trauma, or close contacts with similar symptoms.  Has tried OTC eye drops without relief.  Denies any aggravating factors.  Denies similar symptoms in the past.  Denies fever, chills, nausea, vomiting, eye pain, painful eye movements, halos, discharge, itching, vision changes, double vision, FB sensation, periorbital erythema.     Denies contact lens use.    ROS: As per HPI.  All other pertinent ROS negative.     Past Medical History:  Diagnosis Date  . Cataract    righ teye removed with lens implant, left eye cataract forming   . Diabetes mellitus without complication ([place])   . Hyperlipidemia   . Hypertension   . Sleep apnea    wears cpap    Past Surgical History:  Procedure Laterality Date  . COLONOSCOPY    . CYSTECTOMY     back    Allergies  Allergen Reactions  . Penicillins Rash    Did it involve swelling of the face/tongue/throat, SOB, or low BP? No Did it involve sudden or severe rash/hives, skin peeling, or any reaction on the inside of your mouth or nose? Yes Did you need to seek medical attention at a hospital or doctor's office? Unknown When did it last happen? Childhood  If all above answers are "NO", may proceed with cephalosporin use.    No current facility-administered medications on file prior to encounter.   Current Outpatient Medications on File Prior to Encounter  Medication Sig Dispense Refill  . aspirin EC 81 MG tablet Take 1 tablet (81 mg total) by mouth daily with breakfast. 30 tablet 2  . cholecalciferol (VITAMIN D) 400 units TABS tablet Take 800 Units by mouth daily.     Marland Kitchen gabapentin (NEURONTIN) 300 MG capsule Take 1 capsule (300 mg total) by mouth 4 (four) times daily. (Patient  taking differently: Take 300 mg by mouth 3 (three) times daily. ) 120 capsule 0  . insulin NPH-regular Human (70-30) 100 UNIT/ML injection Inject 60 Units into the skin 2 (two) times daily before a meal.     . Magnesium Gluconate 250 MG TABS Take 250 mg by mouth daily.     . simvastatin (ZOCOR) 40 MG tablet Take 40 mg by mouth daily.     . vitamin B-12 (CYANOCOBALAMIN) 1000 MCG tablet Take 1,000 mcg by mouth daily.     Social History   Socioeconomic History  . Marital status: Single    Spouse name: Not on file  . Number of children: Not on file  . Years of education: Not on file  . Highest education level: Not on file  Occupational History  . Not on file  Tobacco Use  . Smoking status: Never Smoker  . Smokeless tobacco: Never Used  Vaping Use  . Vaping Use: Never used  Substance and Sexual Activity  . Alcohol use: Yes    Comment: very rare   . Drug use: Never  . Sexual activity: Not on file  Other Topics Concern  . Not on file  Social History Narrative  . Not on file   Social Determinants of Health   Financial Resource Strain:   . Difficulty of Paying Living Expenses: Not on file  Food Insecurity:   .  Worried About Charity fundraiser in the Last Year: Not on file  . Ran Out of Food in the Last Year: Not on file  Transportation Needs:   . Lack of Transportation (Medical): Not on file  . Lack of Transportation (Non-Medical): Not on file  Physical Activity:   . Days of Exercise per Week: Not on file  . Minutes of Exercise per Session: Not on file  Stress:   . Feeling of Stress : Not on file  Social Connections:   . Frequency of Communication with Friends and Family: Not on file  . Frequency of Social Gatherings with Friends and Family: Not on file  . Attends Religious Services: Not on file  . Active Member of Clubs or Organizations: Not on file  . Attends Archivist Meetings: Not on file  . Marital Status: Not on file  Intimate Partner Violence:   . Fear  of Current or Ex-Partner: Not on file  . Emotionally Abused: Not on file  . Physically Abused: Not on file  . Sexually Abused: Not on file   Family History  Problem Relation Age of Onset  . Colon cancer Neg Hx   . Colon polyps Neg Hx   . Esophageal cancer Neg Hx   . Rectal cancer Neg Hx   . Stomach cancer Neg Hx     OBJECTIVE:    Visual Acuity  Right Eye Distance:   Left Eye Distance:   Bilateral Distance:    Right Eye Near:   Left Eye Near:    Bilateral Near:      Vitals:   02/19/20 1124  BP: 136/78  Pulse: 79  Resp: 16  Temp: 97.8 F (36.6 C)  SpO2: 96%    Physical Exam Vitals and nursing note reviewed.  Constitutional:      General: He is not in acute distress.    Appearance: Normal appearance. He is normal weight. He is not ill-appearing, toxic-appearing or diaphoretic.  Eyes:     General: Lids are normal. Lids are everted, no foreign bodies appreciated. Vision grossly intact. Gaze aligned appropriately.        Right eye: Discharge present.        Left eye: No foreign body, discharge or hordeolum.     Conjunctiva/sclera:     Right eye: Exudate present.     Left eye: Left conjunctiva is not injected. No exudate. Cardiovascular:     Rate and Rhythm: Normal rate and regular rhythm.     Pulses: Normal pulses.     Heart sounds: Normal heart sounds. No murmur heard.  No friction rub. No gallop.   Pulmonary:     Effort: Pulmonary effort is normal. No respiratory distress.     Breath sounds: Normal breath sounds. No stridor. No wheezing, rhonchi or rales.  Chest:     Chest wall: No tenderness.  Neurological:     Mental Status: He is alert and oriented to person, place, and time.      ASSESSMENT & PLAN:  1. Acute bacterial conjunctivitis of right eye     Meds ordered this encounter  Medications  . ofloxacin (OCUFLOX) 0.3 % ophthalmic solution    Sig: Place 1 drop into the right eye 4 (four) times daily.    Dispense:  5 mL    Refill:  0   Discharge  instructions  Use eye drops as prescribed and to completion Dispose of old contacts and wear glasses until you have finished course of antibiotic  eye drops Wash pillow cases, wash hands regularly with soap and water, avoid touching your face and eyes, wash door handles, light switches, remotes and other objects you frequently touch Return or follow up with PCP if symptoms persists such as fever, chills, redness, swelling, eye pain, painful eye movements, vision changes, etc...  Reviewed expectations re: course of current medical issues. Questions answered. Outlined signs and symptoms indicating need for more acute intervention. Patient verbalized understanding. After Visit Summary given.   Emerson Monte, [place] 02/19/20 1159

## 2020-02-19 NOTE — Discharge Instructions (Addendum)
Use eye drops as prescribed and to completion Dispose of old contacts and wear glasses until you have finished course of antibiotic eye drops Wash pillow cases, wash hands regularly with soap and water, avoid touching your face and eyes, wash door handles, light switches, remotes and other objects you frequently touch Return or follow up with PCP if symptoms persists such as fever, chills, redness, swelling, eye pain, painful eye movements, vision changes, etc... 

## 2020-02-19 NOTE — ED Triage Notes (Signed)
Patient states that he has right eye redness, watery and irritated x 1 day.

## 2020-11-13 IMAGING — US US RENAL
1 series · 14 of 25 positions shown · non-contrast
Comparison: None.

CLINICAL DATA: Acute renal failure

EXAM:
RENAL / URINARY TRACT ULTRASOUND COMPLETE

[Series 1: us renal · 14 of 41 slices shown]
[im 1/41]
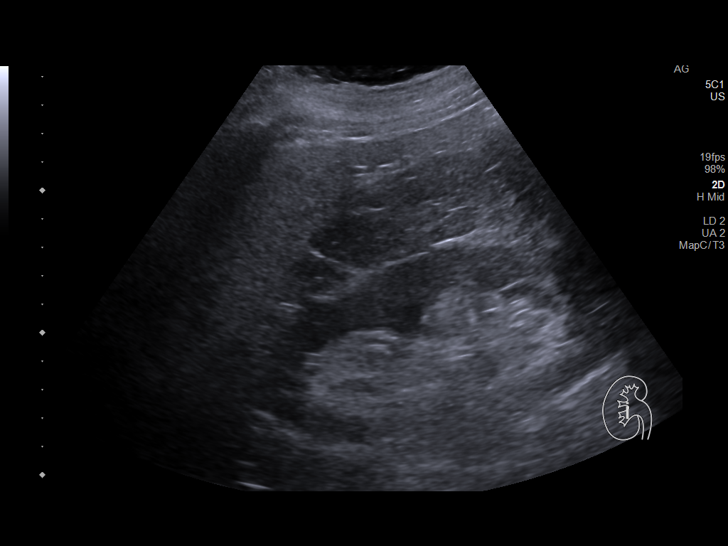
[im 4/41]
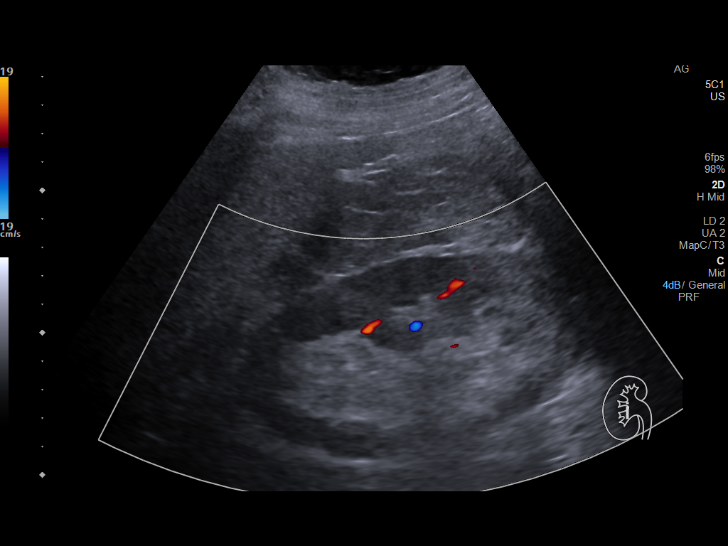
[im 7/41]
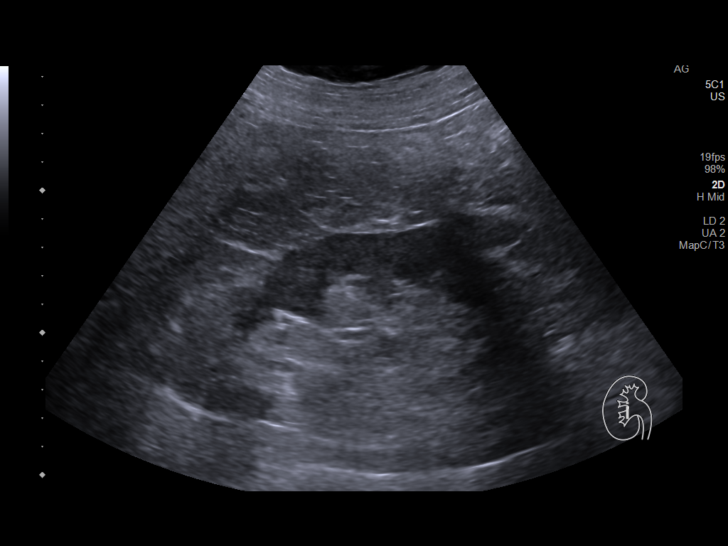
[im 11/41]
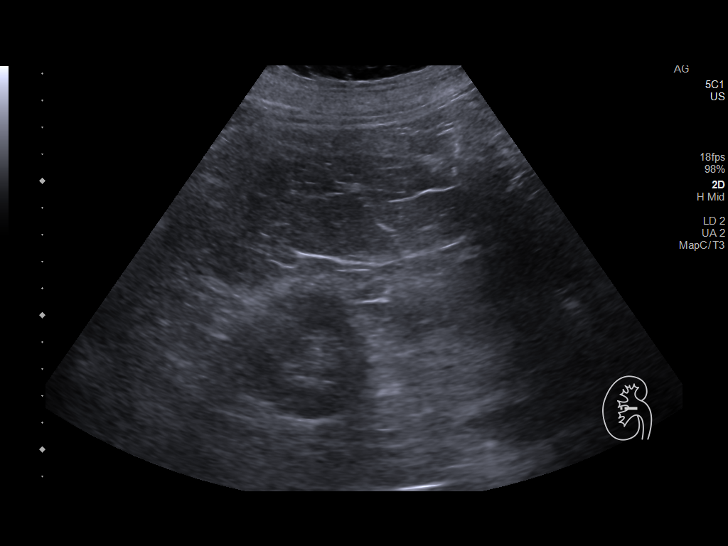
[im 14/41]
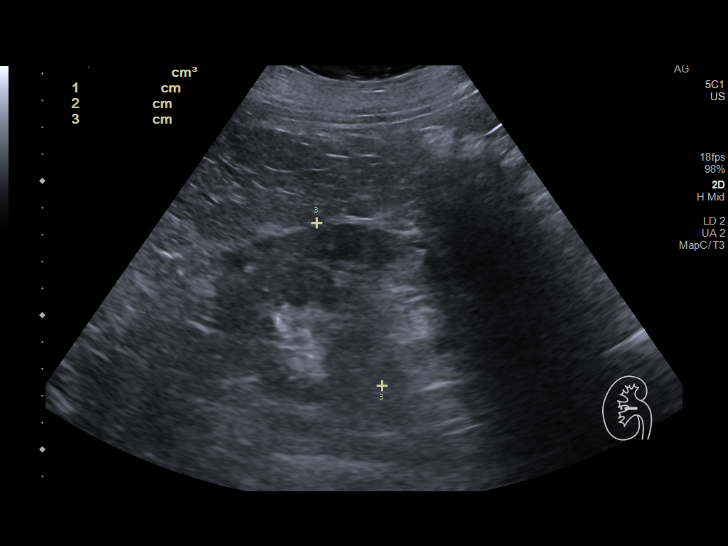
[im 16/41]
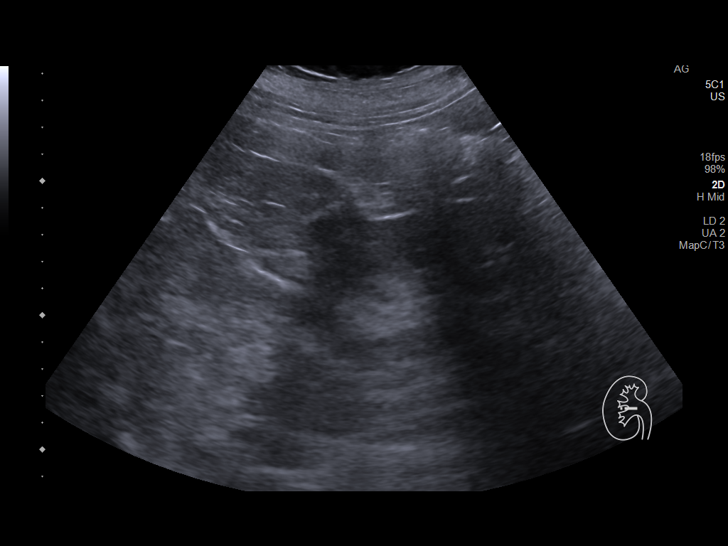
[im 19/41]
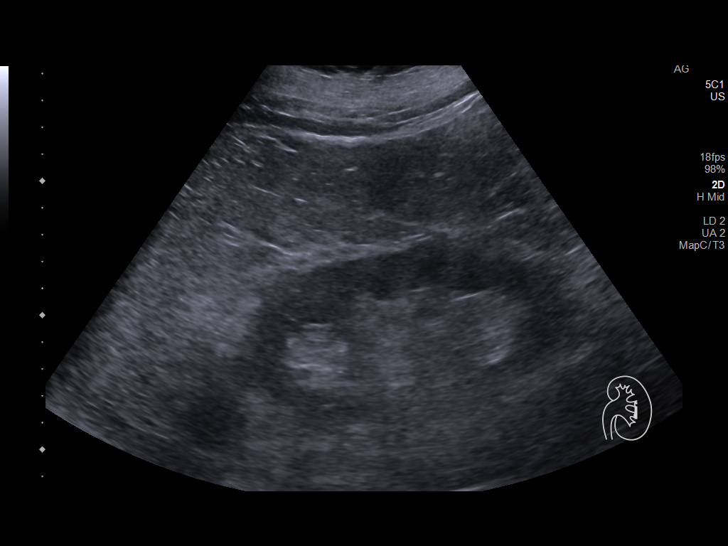
[im 22/41]
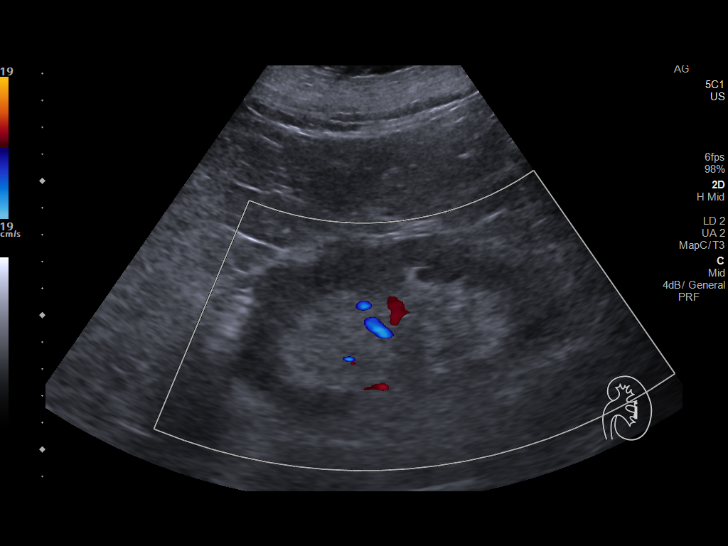
[im 26/41]
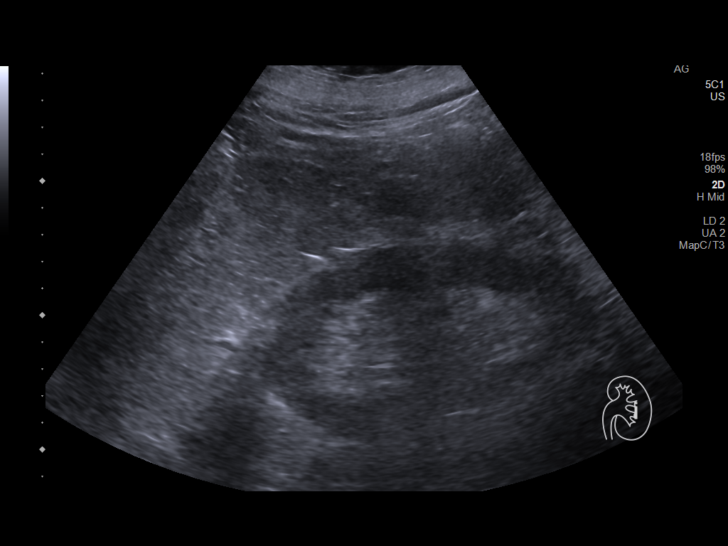
[im 27/41]
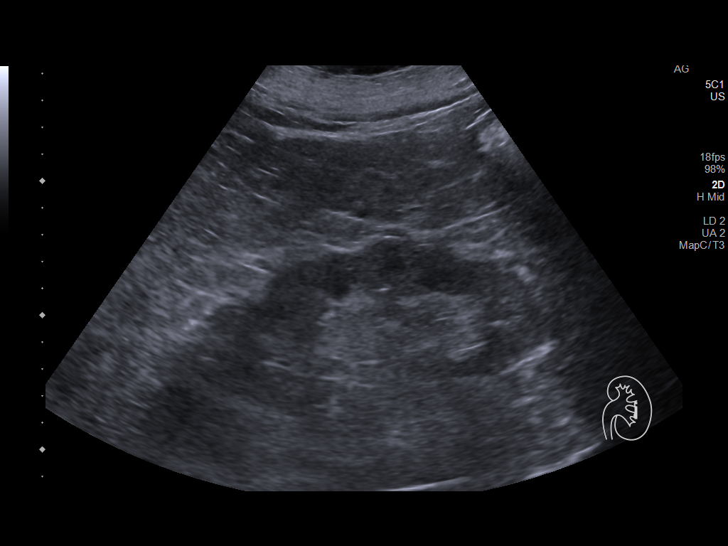
[im 31/41]
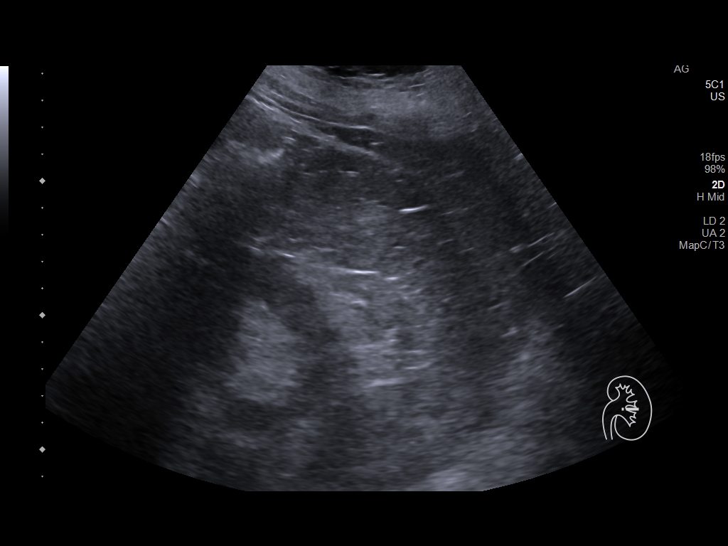
[im 34/41]
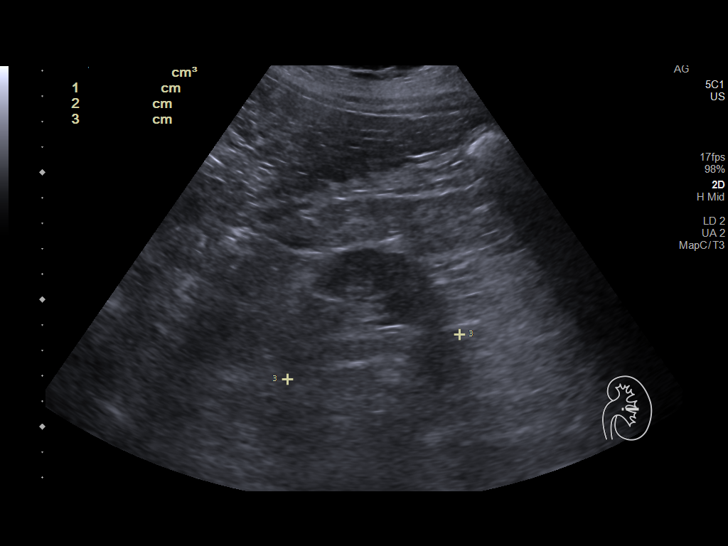
[im 37/41]
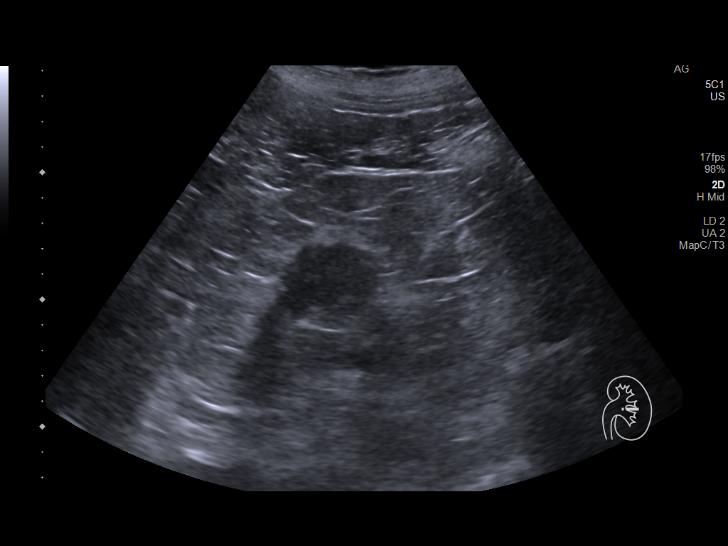
[im 41/41]
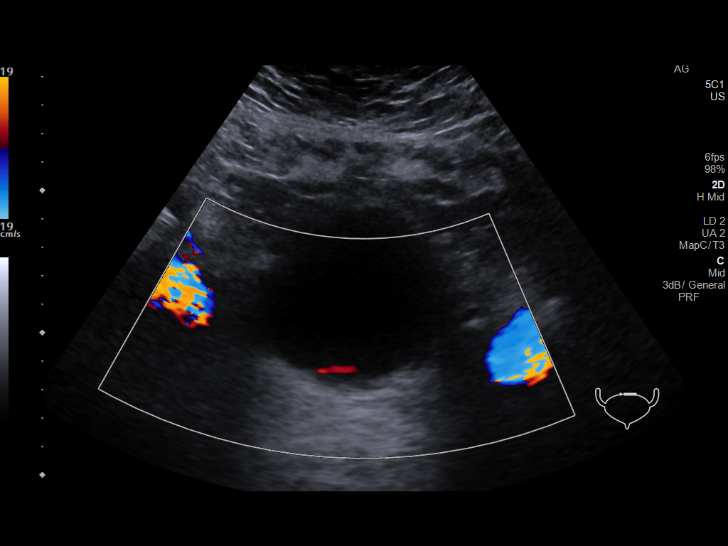

[14 of 25 positions shown; findings below may reference images not displayed]

FINDINGS: Right Kidney:

Renal measurements: 12.7 x 6.7 x 6.5 cm = volume: 290.2 mL .
Echogenicity within normal limits. No mass or hydronephrosis
visualized.

Left Kidney:

Renal measurements: 12.7 x 5.8 x 7.0 cm = volume: 269.2 mL.
Echogenicity within normal limits. No mass or hydronephrosis
visualized.

Bladder:

Appears normal for degree of bladder distention.

Other:

None.
IMPRESSION: No hydronephrosis.

## 2023-12-05 ENCOUNTER — Emergency Department (HOSPITAL_COMMUNITY)

## 2023-12-05 ENCOUNTER — Encounter (HOSPITAL_COMMUNITY): Payer: Self-pay

## 2023-12-05 ENCOUNTER — Other Ambulatory Visit: Payer: Self-pay

## 2023-12-05 ENCOUNTER — Emergency Department (HOSPITAL_COMMUNITY)
Admission: EM | Admit: 2023-12-05 | Discharge: 2023-12-05 | Disposition: A | Discharge status: Home / Self Care | Attending: Emergency Medicine | Admitting: Emergency Medicine

## 2023-12-05 DIAGNOSIS — S6991XA Unspecified injury of right wrist, hand and finger(s), initial encounter: Secondary | ICD-10-CM | POA: Diagnosis present

## 2023-12-05 DIAGNOSIS — S61411A Laceration without foreign body of right hand, initial encounter: Secondary | ICD-10-CM | POA: Diagnosis not present

## 2023-12-05 DIAGNOSIS — Z7982 Long term (current) use of aspirin: Secondary | ICD-10-CM | POA: Insufficient documentation

## 2023-12-05 DIAGNOSIS — W010XXA Fall on same level from slipping, tripping and stumbling without subsequent striking against object, initial encounter: Secondary | ICD-10-CM | POA: Diagnosis not present

## 2023-12-05 DIAGNOSIS — Z23 Encounter for immunization: Secondary | ICD-10-CM | POA: Diagnosis not present

## 2023-12-05 DIAGNOSIS — W19XXXA Unspecified fall, initial encounter: Secondary | ICD-10-CM

## 2023-12-05 DIAGNOSIS — Z794 Long term (current) use of insulin: Secondary | ICD-10-CM | POA: Diagnosis not present

## 2023-12-05 MED ORDER — LIDOCAINE HCL (PF) 1 % IJ SOLN
5.0000 mL | Freq: Once | INTRAMUSCULAR | Status: AC
  Administered 2023-12-05: 5 mL
  Filled 2023-12-05: qty 5

## 2023-12-05 MED ORDER — TETANUS-DIPHTH-ACELL PERTUSSIS 5-2.5-18.5 LF-MCG/0.5 IM SUSY
0.5000 mL | PREFILLED_SYRINGE | Freq: Once | INTRAMUSCULAR | Status: AC
  Administered 2023-12-05: 0.5 mL via INTRAMUSCULAR
  Filled 2023-12-05: qty 0.5

## 2023-12-05 NOTE — Discharge Instructions (Signed)
 As we discussed, the sutures will need to be removed in 10 days. This can be with your primary care doctor, Urgent Care or by returning to the ED.   Go for recheck if there is any increasing pain or swelling, drainage from the wound or fever.
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
## 2023-12-05 NOTE — ED Notes (Signed)
 Pt/family received d/c paperwork at this time. After going over the paperwork any questions, comments, or concerns were answered to the best of this nurse's knowledge. The pt/family verbally acknowledged the teachings/instructions.   Reviewed with son

## 2023-12-05 NOTE — ED Provider Notes (Signed)
 [place] [facility] DEPARTMENT AT [facility] Provider Note   CSN: [id number] Arrival date & time: 12/05/23  1509     Patient presents with: Edward Boyd is a 78 y.o. male.   Patient to ED after fall today when he tripped on a curb and when down on outstretched right hand causing injury to the hand. No head injury, neck or chest pain, no abdominal injury. He got himself up and has been ambulatory without difficulty or pain. Not anticoagulated.   The history is provided by the patient. No language interpreter was used.  Fall       Prior to Admission medications   Medication Sig Start Date End Date Taking? Authorizing Provider  aspirin  EC 81 MG tablet Take 1 tablet (81 mg total) by mouth daily with breakfast. 03/27/19   Pearlean Manus, MD  cholecalciferol (VITAMIN D) 400 units TABS tablet Take 800 Units by mouth daily.     [provider]  gabapentin  (NEURONTIN ) 300 MG capsule Take 1 capsule (300 mg total) by mouth 4 (four) times daily. Patient taking differently: Take 300 mg by mouth 3 (three) times daily.  10/31/17   Gretel Ozell PARAS, DPM  insulin  NPH-regular Human (70-30) 100 UNIT/ML injection Inject 60 Units into the skin 2 (two) times daily before a meal.     [provider]  Magnesium Gluconate 250 MG TABS Take 250 mg by mouth daily.     [provider]  ofloxacin  (OCUFLOX ) 0.3 % ophthalmic solution Place 1 drop into the right eye 4 (four) times daily. 02/19/20   Avegno, Komlanvi S, FNP  simvastatin (ZOCOR) 40 MG tablet Take 40 mg by mouth daily.     [provider]  vitamin B-12 (CYANOCOBALAMIN) 1000 MCG tablet Take 1,000 mcg by mouth daily.    [provider]    Allergies: Penicillins    Review of Systems  Updated Vital Signs BP (!) 152/73 (BP Location: Left Arm)   Pulse 88   Temp 98.2 F (36.8 C) (Oral)   Resp 20   Ht 5' 10 (1.778 m)   Wt 90.7 kg   SpO2 93%   BMI 28.70 kg/m   Physical  Exam Vitals and nursing note reviewed.  Constitutional:      Appearance: He is well-developed. He is obese.  Pulmonary:     Effort: Pulmonary effort is normal.  Musculoskeletal:        General: Normal range of motion.     Cervical back: Normal range of motion.     Comments: 10 cm laceration across palm of right hand. Full range of active and passive motion of all digits. Neurovascularly intact.   Skin:    General: Skin is warm and dry.  Neurological:     Mental Status: He is alert and oriented to person, place, and time.     (all labs ordered are listed, but only abnormal results are displayed) Labs Reviewed - No data to display  EKG: None  Radiology: DG Hand Complete Right Result Date: 12/05/2023 CLINICAL DATA:  open wound - eval fb, fracture EXAM: RIGHT HAND - COMPLETE 3+ VIEW COMPARISON:  None Available. FINDINGS: No acute fracture or dislocation. Moderate joint space loss and osteophyte formation involving the middle and distal interphalangeal joints of the second through fourth digits. Moderate joint space loss of the first carpometacarpal joint. Peripheral vascular atherosclerosis. No radiopaque foreign body. IMPRESSION: 1. No acute fracture or dislocation. No radiopaque foreign body. 2. 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 Electronically Signed   By: Rogelia Myers M.D.   On: 12/05/2023 16:26     .Laceration Repair  Date/Time: 12/05/2023 5:45 PM  Performed by: Odell Balls, PA-C Authorized by: Odell Balls, PA-C   Consent:    Consent obtained:  Verbal Universal protocol:    Procedure explained and questions answered to patient or proxy's satisfaction: yes     Patient identity confirmed:  Verbally with patient Laceration details:    Location:  Hand   Hand location:  R palm   Length (cm):  10 Pre-procedure details:    Preparation:  Patient was prepped and draped in usual sterile fashion and imaging obtained  to evaluate for foreign bodies Exploration:    Limited defect created (wound extended): no     Imaging obtained: x-ray     Imaging outcome: foreign body not noted     Wound exploration: wound explored through full range of motion and entire depth of wound visualized     Wound extent: no tendon damage     Contaminated: no   Treatment:    Area cleansed with:  Povidone-iodine and saline   Amount of cleaning:  Extensive   Irrigation method:  Pressure wash   Debridement:  Minimal Skin repair:    Repair method:  Sutures   Suture size:  4-0   Suture material:  Prolene   Suture technique:  Simple interrupted   Number of sutures:  11 Approximation:    Approximation:  Close Repair type:    Repair type:  Simple Post-procedure details:    Dressing:  Antibiotic ointment and non-adherent dressing   Procedure completion:  Tolerated well, no immediate complications    Medications Ordered in the ED  lidocaine  (PF) (XYLOCAINE ) 1 % injection 5 mL (5 mLs Infiltration Given by Other 12/05/23 1641)  Tdap (BOOSTRIX ) injection 0.5 mL (0.5 mLs Intramuscular Given 12/05/23 1640)    Clinical Course as of 12/05/23 1751  Fri Dec 05, 2023  1643 Patient with mechanical fall and right hand injury. Xray without fracture or foreign body visualized.  [SU]  1748 Wound repaired as per procedure note. Tetanus updated. Xray without foreign body or fracture. Wound care and suture removal instructions discussed.  [SU]    Clinical Course User Index [SU] Odell Balls, PA-C                                 Medical Decision Making Amount and/or Complexity of Data Reviewed Radiology: ordered.  Risk Prescription drug management.        Final diagnoses:  Fall, initial encounter  Laceration of right hand without foreign body, initial encounter    ED Discharge Orders     None          Odell Balls, PA-C 12/05/23 1751    Towana Ozell BROCKS, MD 12/06/23 [id number]
